# Patient Record
Sex: Female | Born: 1955
Health system: Southern US, Community
[De-identification: ages and names within clinical notes are randomized; demographics above are authoritative.]

## PROBLEM LIST (undated history)

## (undated) DIAGNOSIS — M5136 Other intervertebral disc degeneration, lumbar region: Secondary | ICD-10-CM

## (undated) DIAGNOSIS — D259 Leiomyoma of uterus, unspecified: Secondary | ICD-10-CM

## (undated) DIAGNOSIS — I700 Atherosclerosis of aorta: Secondary | ICD-10-CM

## (undated) DIAGNOSIS — K5790 Diverticulosis of intestine, part unspecified, without perforation or abscess without bleeding: Secondary | ICD-10-CM

## (undated) DIAGNOSIS — K649 Unspecified hemorrhoids: Secondary | ICD-10-CM

## (undated) DIAGNOSIS — K219 Gastro-esophageal reflux disease without esophagitis: Secondary | ICD-10-CM

## (undated) DIAGNOSIS — M5020 Other cervical disc displacement, unspecified cervical region: Secondary | ICD-10-CM

## (undated) DIAGNOSIS — M8580 Other specified disorders of bone density and structure, unspecified site: Secondary | ICD-10-CM

## (undated) DIAGNOSIS — Z8739 Personal history of other diseases of the musculoskeletal system and connective tissue: Secondary | ICD-10-CM

## (undated) DIAGNOSIS — K449 Diaphragmatic hernia without obstruction or gangrene: Secondary | ICD-10-CM

## (undated) DIAGNOSIS — I341 Nonrheumatic mitral (valve) prolapse: Secondary | ICD-10-CM

## (undated) DIAGNOSIS — K8020 Calculus of gallbladder without cholecystitis without obstruction: Secondary | ICD-10-CM

## (undated) DIAGNOSIS — K635 Polyp of colon: Secondary | ICD-10-CM

## (undated) DIAGNOSIS — E161 Other hypoglycemia: Secondary | ICD-10-CM

## (undated) DIAGNOSIS — M51369 Other intervertebral disc degeneration, lumbar region without mention of lumbar back pain or lower extremity pain: Secondary | ICD-10-CM

## (undated) DIAGNOSIS — K648 Other hemorrhoids: Secondary | ICD-10-CM

## (undated) HISTORY — DX: Atherosclerosis of aorta: I70.0

## (undated) HISTORY — PX: WISDOM TOOTH EXTRACTION: SHX21

## (undated) HISTORY — DX: Other cervical disc displacement, unspecified cervical region: M50.20

## (undated) HISTORY — DX: Diverticulosis of intestine, part unspecified, without perforation or abscess without bleeding: K57.90

## (undated) HISTORY — DX: Other hemorrhoids: K64.8

## (undated) HISTORY — DX: Other hypoglycemia: E16.1

## (undated) HISTORY — DX: Leiomyoma of uterus, unspecified: D25.9

## (undated) HISTORY — DX: Gastro-esophageal reflux disease without esophagitis: K21.9

## (undated) HISTORY — DX: Other specified disorders of bone density and structure, unspecified site: M85.80

## (undated) HISTORY — DX: Calculus of gallbladder without cholecystitis without obstruction: K80.20

## (undated) HISTORY — DX: Diaphragmatic hernia without obstruction or gangrene: K44.9

## (undated) HISTORY — DX: Nonrheumatic mitral (valve) prolapse: I34.1

## (undated) HISTORY — DX: Polyp of colon: K63.5

## (undated) HISTORY — DX: Personal history of other diseases of the musculoskeletal system and connective tissue: Z87.39

## (undated) HISTORY — DX: Other intervertebral disc degeneration, lumbar region: M51.36

## (undated) HISTORY — DX: Other intervertebral disc degeneration, lumbar region without mention of lumbar back pain or lower extremity pain: M51.369

## (undated) HISTORY — DX: Unspecified hemorrhoids: K64.9

---

## 1999-06-10 ENCOUNTER — Other Ambulatory Visit: Admission: RE | Admit: 1999-06-10 | Discharge: 1999-06-10 | Payer: Self-pay | Admitting: Gynecology

## 2000-06-03 ENCOUNTER — Encounter: Payer: Self-pay | Admitting: Gynecology

## 2000-06-03 ENCOUNTER — Encounter: Admission: RE | Admit: 2000-06-03 | Discharge: 2000-06-03 | Payer: Self-pay | Admitting: Gynecology

## 2000-06-08 ENCOUNTER — Emergency Department (HOSPITAL_COMMUNITY): Admission: EM | Admit: 2000-06-08 | Discharge: 2000-06-08 | Payer: Self-pay | Admitting: Emergency Medicine

## 2000-07-09 ENCOUNTER — Encounter (INDEPENDENT_AMBULATORY_CARE_PROVIDER_SITE_OTHER): Payer: Self-pay | Admitting: Specialist

## 2000-07-09 ENCOUNTER — Ambulatory Visit (HOSPITAL_COMMUNITY): Admission: RE | Admit: 2000-07-09 | Discharge: 2000-07-09 | Payer: Self-pay | Admitting: Gastroenterology

## 2000-07-31 ENCOUNTER — Other Ambulatory Visit: Admission: RE | Admit: 2000-07-31 | Discharge: 2000-07-31 | Payer: Self-pay | Admitting: Gynecology

## 2001-02-13 ENCOUNTER — Encounter: Admission: RE | Admit: 2001-02-13 | Discharge: 2001-02-13 | Payer: Self-pay | Admitting: Gastroenterology

## 2001-02-13 ENCOUNTER — Encounter: Payer: Self-pay | Admitting: Gastroenterology

## 2001-02-25 ENCOUNTER — Ambulatory Visit (HOSPITAL_COMMUNITY): Admission: RE | Admit: 2001-02-25 | Discharge: 2001-02-25 | Payer: Self-pay | Admitting: Gastroenterology

## 2001-08-11 ENCOUNTER — Other Ambulatory Visit: Admission: RE | Admit: 2001-08-11 | Discharge: 2001-08-11 | Payer: Self-pay | Admitting: Gynecology

## 2002-09-27 ENCOUNTER — Ambulatory Visit (HOSPITAL_COMMUNITY): Admission: RE | Admit: 2002-09-27 | Discharge: 2002-09-27 | Payer: Self-pay | Admitting: *Deleted

## 2002-11-11 ENCOUNTER — Encounter: Admission: RE | Admit: 2002-11-11 | Discharge: 2003-02-09 | Payer: Self-pay | Admitting: Family Medicine

## 2002-11-24 ENCOUNTER — Encounter: Admission: RE | Admit: 2002-11-24 | Discharge: 2002-11-24 | Payer: Self-pay | Admitting: Family Medicine

## 2002-11-24 ENCOUNTER — Encounter: Payer: Self-pay | Admitting: Family Medicine

## 2002-12-21 ENCOUNTER — Other Ambulatory Visit: Admission: RE | Admit: 2002-12-21 | Discharge: 2002-12-21 | Payer: Self-pay | Admitting: Gynecology

## 2003-02-10 ENCOUNTER — Encounter: Admission: RE | Admit: 2003-02-10 | Discharge: 2003-04-19 | Payer: Self-pay | Admitting: Family Medicine

## 2003-11-14 ENCOUNTER — Encounter: Admission: RE | Admit: 2003-11-14 | Discharge: 2003-11-14 | Payer: Self-pay | Admitting: Gynecology

## 2003-12-06 ENCOUNTER — Encounter: Admission: RE | Admit: 2003-12-06 | Discharge: 2003-12-06 | Payer: Self-pay | Admitting: Gynecology

## 2003-12-23 ENCOUNTER — Other Ambulatory Visit: Admission: RE | Admit: 2003-12-23 | Discharge: 2003-12-23 | Payer: Self-pay | Admitting: Gynecology

## 2004-12-25 ENCOUNTER — Other Ambulatory Visit: Admission: RE | Admit: 2004-12-25 | Discharge: 2004-12-25 | Payer: Self-pay | Admitting: Gynecology

## 2006-01-08 ENCOUNTER — Encounter: Admission: RE | Admit: 2006-01-08 | Discharge: 2006-01-08 | Payer: Self-pay | Admitting: Gynecology

## 2006-01-09 ENCOUNTER — Other Ambulatory Visit: Admission: RE | Admit: 2006-01-09 | Discharge: 2006-01-09 | Payer: Self-pay | Admitting: Gynecology

## 2007-02-27 ENCOUNTER — Other Ambulatory Visit: Admission: RE | Admit: 2007-02-27 | Discharge: 2007-02-27 | Payer: Self-pay | Admitting: Gynecology

## 2007-03-06 ENCOUNTER — Encounter: Admission: RE | Admit: 2007-03-06 | Discharge: 2007-03-06 | Payer: Self-pay | Admitting: Gynecology

## 2007-03-11 ENCOUNTER — Emergency Department (HOSPITAL_COMMUNITY): Admission: EM | Admit: 2007-03-11 | Discharge: 2007-03-11 | Payer: Self-pay | Admitting: Family Medicine

## 2007-03-13 ENCOUNTER — Encounter: Admission: RE | Admit: 2007-03-13 | Discharge: 2007-03-13 | Payer: Self-pay | Admitting: Specialist

## 2008-01-29 ENCOUNTER — Other Ambulatory Visit: Admission: RE | Admit: 2008-01-29 | Discharge: 2008-01-29 | Payer: Self-pay | Admitting: Gynecology

## 2008-02-01 ENCOUNTER — Emergency Department (HOSPITAL_COMMUNITY): Admission: EM | Admit: 2008-02-01 | Discharge: 2008-02-01 | Payer: Self-pay | Admitting: Family Medicine

## 2008-02-17 ENCOUNTER — Encounter: Admission: RE | Admit: 2008-02-17 | Discharge: 2008-02-17 | Payer: Self-pay | Admitting: Gynecology

## 2008-02-28 ENCOUNTER — Emergency Department (HOSPITAL_COMMUNITY): Admission: EM | Admit: 2008-02-28 | Discharge: 2008-02-28 | Payer: Self-pay | Admitting: Family Medicine

## 2008-11-01 ENCOUNTER — Emergency Department (HOSPITAL_COMMUNITY): Admission: EM | Admit: 2008-11-01 | Discharge: 2008-11-01 | Payer: Self-pay | Admitting: Emergency Medicine

## 2009-02-27 ENCOUNTER — Ambulatory Visit: Payer: Self-pay | Admitting: Gynecology

## 2009-02-27 ENCOUNTER — Encounter: Payer: Self-pay | Admitting: Gynecology

## 2009-02-27 ENCOUNTER — Other Ambulatory Visit: Admission: RE | Admit: 2009-02-27 | Discharge: 2009-02-27 | Payer: Self-pay | Admitting: Gynecology

## 2009-04-03 ENCOUNTER — Emergency Department (HOSPITAL_COMMUNITY): Admission: EM | Admit: 2009-04-03 | Discharge: 2009-04-03 | Payer: Self-pay | Admitting: Family Medicine

## 2009-06-22 ENCOUNTER — Encounter: Admission: RE | Admit: 2009-06-22 | Discharge: 2009-06-22 | Payer: Self-pay | Admitting: Gynecology

## 2009-09-21 ENCOUNTER — Ambulatory Visit: Payer: Self-pay | Admitting: Gynecology

## 2009-12-01 ENCOUNTER — Emergency Department (HOSPITAL_COMMUNITY): Admission: EM | Admit: 2009-12-01 | Discharge: 2009-12-01 | Payer: Self-pay | Admitting: Emergency Medicine

## 2009-12-21 ENCOUNTER — Emergency Department (HOSPITAL_COMMUNITY): Admission: EM | Admit: 2009-12-21 | Discharge: 2009-12-21 | Payer: Self-pay | Admitting: Family Medicine

## 2010-03-30 ENCOUNTER — Ambulatory Visit: Payer: Self-pay | Admitting: Gynecology

## 2010-03-30 ENCOUNTER — Other Ambulatory Visit: Admission: RE | Admit: 2010-03-30 | Discharge: 2010-03-30 | Payer: Self-pay | Admitting: Gynecology

## 2010-05-10 ENCOUNTER — Ambulatory Visit: Payer: Self-pay | Admitting: Gynecology

## 2010-05-15 ENCOUNTER — Ambulatory Visit: Payer: Self-pay | Admitting: Gynecology

## 2010-08-16 ENCOUNTER — Ambulatory Visit: Payer: Self-pay | Admitting: Gynecology

## 2010-08-31 ENCOUNTER — Encounter: Admission: RE | Admit: 2010-08-31 | Discharge: 2010-08-31 | Payer: Self-pay | Admitting: Gynecology

## 2010-11-24 ENCOUNTER — Encounter: Payer: Self-pay | Admitting: Gynecology

## 2010-11-25 ENCOUNTER — Encounter: Payer: Self-pay | Admitting: Gynecology

## 2011-01-17 DIAGNOSIS — J209 Acute bronchitis, unspecified: Secondary | ICD-10-CM

## 2011-03-19 NOTE — Consult Note (Signed)
Paige Lawrence, Paige Lawrence               ACCOUNT NO.:  [id number]   MEDICAL RECORD NO.:  [id number]          PATIENT TYPE:  EMS   LOCATION:  [facility]JO                         FACILITY:  [facility]   PHYSICIAN:  Vania Rea, M.D. DATE OF BIRTH:  08/25/1956   DATE OF CONSULTATION:  11/01/2008  DATE OF DISCHARGE:  11/01/2008                                 CONSULTATION   PRIMARY CARE PHYSICIAN:  Unassigned.   CARDIOLOGIST:  Madaline Savage, M.D.   CHIEF COMPLAINT:  Chest pressure.   HISTORY OF PRESENT ILLNESS:  This is a 55 year old Caucasian lady with a  history of anxiety disorder, mitral valve prolapse, and reported  episodes of reactive hypoglycemia who also reports a long history of  musculoskeletal problems related to both shoulders, who has been having  aching pain in the left arm episodically for about two weeks.  It  restarted today again and after a few hours of left arm achiness, she  had sudden onset of essential chest pressure associated heat in her body  and a discomfort in the left side of her neck.  After about an hour the  patient presented to the emergency room but problem had already resolved  by the time the patient got to the emergency room.  She received 161 mg  of aspirin but was afraid to take more aspirin because she does have a  history of GERD.  The patient has had no dyspnea nor dizziness.  She has  had no fever nor chills.  She does not have a history of PND nor lower  extremity edema.  The patient has been seen some five hours after the  initial onset of her pain, some four hours after presenting to the  emergency room, and she is requesting to be discharged home because she  feels fine.  She has had no similar episodes.  She did have stress test  done about four years ago which was negative.  She denies any history of  hyperlipidemia or diabetes.  She is cutting down on her smoking and  currently smokes less than half a pack per day.   PAST MEDICAL HISTORY:  1. Mitral valve prolapse.  2. Episodic hypoglycemia.  3. History of anxiety.  4. History of GERD.  5. History of bursitis of both shoulders.  6. History of rotator cuff tear in the right shoulder.  7. History of right latissimus dorsi spasm causing epigastric      discomfort, resolved with physical therapy.   MEDICATIONS:  Toprol XL, unknown dose, which she takes p.r.n. for  palpitations associated with mitral valve prolapse but has not taken any  for a very long time.  She says if she takes it for prolonged periods  she feels very sick.   ALLERGIES:  1. IBUPROFEN.  2. SULFA.   SOCIAL HISTORY:  She smokes less than half a pack per day.  She has  about two mixed drinks per month.  She denies illicit drug use.  She  works at [employer] in the [employer].  She does  secretarial work.   FAMILY HISTORY:  Significant for coronary artery disease in her brother  and hypertension in her mother.   REVIEW OF SYSTEMS:  On a 10-point review of systems, other than noted  above, is unremarkable.   PHYSICAL EXAMINATION:  GENERAL:  Pleasant, middle-aged, Caucasian lady  sitting up in bed, jovial and in no distress.  VITAL SIGNS:  Temperature is 98.5, her pulse is 85, respirations 16,  blood pressure 146/89.  She is saturating 98% on 2 L.  HEENT:  Her pupils are round and equal.  Mucous membranes pink and  anicteric.  NECK:  She has no cervical lymphadenopathy or thyromegaly.  No jugular  venous distention.  No carotid bruit.  CHEST:  Clear to auscultation bilaterally.  CARDIOVASCULAR:  Regular rhythm without murmur.  ABDOMEN:  Scaphoid, soft, and nontender.  EXTREMITIES:  Without edema.  She has dorsalis pedis pulses of 3+  bilaterally and equal.  CENTRAL NERVOUS SYSTEM:  Cranial nerves II-XII are grossly intact and  she has no focal neurologic deficits.   LABORATORY DATA:  Her white count is 6.8, hemoglobin 14.9, MCV is  elevated at 101.6, platelet count is normal at 198.   She has a normal  differential.  Her serum chemistry is essentially normal.  Her D-dimer  is negative.  First set of cardiac enzymes showed undetectable CK-MB,  undetectable troponin, and Myoglobin of 40.  Cardiac enzymes done at 9  p.m. some six hours after the onset of symptoms:  Her total CK is 51,  her CK-MB is 1.1, and her troponin is undetectable.  EKG shows sinus  rhythm.  No ST segment abnormality.   ASSESSMENT:  1. Atypical chest pain.  2. History of gastroesophageal reflux disease.  3. Extensive history of musculoskeletal disease.  4. History of mitral valve prolapse.   RECOMMENDATIONS:  1. Discharge home since the patient is requesting that.  2. Low dose metoprolol 12.5 mg twice daily.  3. Proton pump inhibitor.  4. Omeprazole at high dose 20 mg twice daily.  5. Follow up with primary cardiologist, Dr. Elsie Lincoln for stress test.  6. The patient is to return to the emergency room for recurrence of      symptoms or worsening of symptoms.      Vania Rea, M.D.  Electronically Signed     LC/MEDQ  D:  11/01/2008  T:  11/02/2008  Job:  [id number]   cc:   Madaline Savage, M.D.  Elise Benne, M.D.

## 2011-03-22 NOTE — Procedures (Signed)
[facility]  Patient:    Paige Lawrence, Paige Lawrence                      MRN: [id number] Proc. Date: 02/25/01 Adm. Date:  [id number] Attending:  Orland Mustard CC:         Desma Maxim, M.D.   Procedure Report  PROCEDURE:  Esophagogastroduodenoscopy.  MEDICATIONS:  Hurricaine spray, Fentanyl 50 mEq, and Versed 1 gm IV.  INDICATIONS:  Epigastric pain which has been refractory to proton pump inhibitors.  PROCEDURE: The procedure had been explained to the patient and consent obtained.  He was placed in the left lateral decubitus position, and the Olympus videoscope was inserted ______ into the esophagus and advanced under direct visualization. The stomach was entered.  The pylorus was identified and passed.  The duodenum, including the bulb and second portion were seen well and were unremarkable. The scope was withdrawn back to the antrum.  The antrum was normal.  The fundus and cardia were seen well in the retroflexed view and were normal.  There was a small hernia.  The distal esophagus was somewhat reddened.  There were no signs of active bleeding or ulceration.  The proximal esophagus was normal. The scope was withdrawn.  The patient tolerated the procedure well.  ASSESSMENT: Probable mild esophageal reflux.  PLAN: Continue on Nexium.  Give a reflux sheet.  See back in the office in six weeks. DD:  02/25/01 TD:  02/26/01 Job: [id number]

## 2011-03-22 NOTE — Procedures (Signed)
[facility]  Patient:    Paige Lawrence, Paige Lawrence                      MRN: [id number] Proc. Date: 07/09/00 Adm. Date:  [id number] Attending:  Orland Mustard CC:         Desma Maxim, M.D.                           Procedure Report  PROCEDURE:  Colonoscopy and polypectomy.  MEDICATIONS:  Fentanyl 100 mcg, Versed 9 mg IV.  SCOPE:  Olympus pediatric video colonoscope.  INDICATIONS FOR PROCEDURE:  New onset of bloody diarrhea. She has an aunt that had colon cancer. The bloody diarrhea has resolved. Stools remain positive and for this reason, a colonoscopy is performed.  DESCRIPTION OF PROCEDURE:  The procedure had been explained to the patient and consent obtained. With the patient in the left lateral decubitus position, the Olympus pediatric video colonoscope was then inserted following a digital rectal exam and advanced under direct vision. The prep was quite good. We were able to advance over to the cecum using abdominal pressure and position changes. She had a somewhat long tortuous colon. The ileocecal valve was seen as was the appendiceal orifice. Approximately 2 cm from the appendix was a sessile polyp approximately 0.5 cm. This was removed with a mini snare and sucked through the scope. No other polyps were seen in the cecum, ascending, transverse, descending or sigmoid colon and there was no significant diverticular disease. In the rectum at approximately 20 cm was another 0.5 cm sessile polyp that was removed with the snare and sucked through the scope. These were sent off in different jars. Moderate internal hemorrhoids are seen in the rectum upon removal of the scope. The scope was withdrawn. The patient tolerated the procedure well and was maintained on low flow oxygen and pulse oximeter throughout the procedure with no obvious problems.  ASSESSMENT: 1. Polyps in the cecum and rectum removed. 2. Internal hemorrhoids.  PLAN:  Will check  path on polyps and will recommend repeating procedure in 2 years. Will see her back in the office in 3 months and recheck her stools. Routine post polypectomy instructions. DD:  07/09/00 TD:  07/09/00 Job: [id number]

## 2011-05-30 ENCOUNTER — Encounter: Payer: Self-pay | Admitting: *Deleted

## 2011-06-04 ENCOUNTER — Ambulatory Visit (INDEPENDENT_AMBULATORY_CARE_PROVIDER_SITE_OTHER): Payer: 59 | Admitting: Gynecology

## 2011-06-04 ENCOUNTER — Other Ambulatory Visit (HOSPITAL_COMMUNITY)
Admission: RE | Admit: 2011-06-04 | Discharge: 2011-06-04 | Disposition: A | Discharge status: Home / Self Care | Payer: 59 | Source: Ambulatory Visit | Attending: Gynecology | Admitting: Gynecology

## 2011-06-04 ENCOUNTER — Encounter: Payer: Self-pay | Admitting: Gynecology

## 2011-06-04 VITALS — BP 116/62 | Ht 63.75 in | Wt 121.0 lb

## 2011-06-04 DIAGNOSIS — R109 Unspecified abdominal pain: Secondary | ICD-10-CM

## 2011-06-04 DIAGNOSIS — Z131 Encounter for screening for diabetes mellitus: Secondary | ICD-10-CM

## 2011-06-04 DIAGNOSIS — Z01419 Encounter for gynecological examination (general) (routine) without abnormal findings: Secondary | ICD-10-CM | POA: Insufficient documentation

## 2011-06-04 DIAGNOSIS — Z13220 Encounter for screening for lipoid disorders: Secondary | ICD-10-CM

## 2011-06-04 DIAGNOSIS — M899 Disorder of bone, unspecified: Secondary | ICD-10-CM

## 2011-06-04 DIAGNOSIS — R82998 Other abnormal findings in urine: Secondary | ICD-10-CM

## 2011-06-04 DIAGNOSIS — M949 Disorder of cartilage, unspecified: Secondary | ICD-10-CM

## 2011-06-04 NOTE — Progress Notes (Signed)
Paige Lawrence 1956/10/09 [id number]   History:    55 y.o.  for annual exam overall doing well. Postmenopausal with no history of bleeding. The followed for osteopenia. Does note some abdominal symptoms where she reports intermittently feeling almost pulsations in her lower abdomen. No nausea vomiting diarrhea or constipation doesn't occur all the time but she does actually notice it seems to be more with stress.  Past medical history,surgical history, family history and social history were all reviewed and documented in the EPIC chart. ROS:  Was performed and pertinent positives and negatives are included in the history.  Exam: chaperone present Filed Vitals:   06/04/11 1200  BP: 116/62   General appearance  Normal Skin grossly normal Head/Neck normal with no cervical or supraclavicular adenopathy thyroid normal Lungs  clear Cardiac RR, without RMG Abdominal  soft, nontender, without masses, guarding, rebound or organomegaly, no evidence of pulsation or palpable aneurysm Breasts  examined lying and sitting without masses, retractions, discharge or axillary adenopathy. Pelvic  Ext/BUS/vagina  normal mild atrophic changes  Cervix  normal   Uterus  anteverted, normal size, shape and contour, midline and mobile nontender   Adnexa  Without masses or tenderness  Anus and perineum  normal   Rectovaginal  normal sphincter tone without palpated masses or tenderness.   Assessment/Plan:  55 y.o. female for annual exam . #1 Gen. Health maintenance suppressants on the basis discussed at her she's due for mammogram now and she knows to schedule this. Increase calcium vitamin D was also reviewed. #2 Osteopenia last bone density July 2011 showed T score -2.2 at the AP spine all other values were normal. Vitamin D level was 39 in October 2011 the plan was to observe and repeat her bone density at a two-year interval. She is taking extra calcium vitamin D we'll go ahead and check baseline vitamin D  level today. #3 Lower abdominal discomfort patient reports pulsation-like feeling in her lower abdomen her exam is totally normal she states she she is due for colonoscopy now I've recommended that she go ahead and schedule this and let them take a look at her first period she's comfortable with GI followup first and then go from there. I put her in for a consult with the [facility] at her request she knows that if she does not hear from [place] or somebody else the tissue call me in 2 weeks to make sure that we arrange this. I did review with her the differential to include aneurysm although unlikely given the her normal exam, intermittent nature of symptoms and the association with anxiety. I suspect it's some form of irritable bowel. #4 Smoking. I again reviewed her smoking history offered help in cessation. Patient declines understands the risks. Assuming patient continues well from a gynecologic standpoint he'll see me in a year sooner as needed      Dara Lords MD, 2:05 PM 06/04/2011

## 2011-06-05 LAB — VITAMIN D 25 HYDROXY (VIT D DEFICIENCY, FRACTURES): Vit D, 25-Hydroxy: 37 ng/mL (ref 30–89)

## 2011-08-02 ENCOUNTER — Encounter: Payer: Self-pay | Admitting: Gastroenterology

## 2011-08-08 LAB — POCT CARDIAC MARKERS
CKMB, poc: <1 ng/mL — ABNORMAL LOW (ref 1.0–8.0)
Troponin i, poc: <0.05 ng/mL (ref 0.00–0.09)

## 2011-08-08 LAB — DIFFERENTIAL
Lymphocytes Relative: 22 % (ref 12–46)
Lymphs Abs: 1.5 10*3/uL (ref 0.7–4.0)
Monocytes Absolute: 0.5 10*3/uL (ref 0.1–1.0)
Neutro Abs: 4.7 10*3/uL (ref 1.7–7.7)

## 2011-08-08 LAB — POCT I-STAT, CHEM 8
Calcium, Ion: 1.25 mmol/L (ref 1.12–1.32)
Creatinine, Ser: 0.9 mg/dL (ref 0.4–1.2)
Glucose, Bld: 87 mg/dL (ref 70–99)
HCT: 45 % (ref 36.0–46.0)
Hemoglobin: 15.3 g/dL — ABNORMAL HIGH (ref 12.0–15.0)

## 2011-08-08 LAB — TROPONIN I: Troponin I: <0.01 ng/mL (ref 0.00–0.06)

## 2011-08-08 LAB — CK TOTAL AND CKMB (NOT AT ARMC)
Relative Index: INVALID (ref 0.0–2.5)
Total CK: 51 U/L (ref 7–177)

## 2011-08-08 LAB — CBC
MCHC: 34.1 g/dL (ref 30.0–36.0)
MCV: 101.6 fL — ABNORMAL HIGH (ref 78.0–100.0)
Platelets: 198 10*3/uL (ref 150–400)
RDW: 13.1 % (ref 11.5–15.5)

## 2011-08-08 LAB — D-DIMER, QUANTITATIVE: D-Dimer, Quant: <0.22 ug/mL-FEU (ref 0.00–0.48)

## 2011-08-22 ENCOUNTER — Encounter: Payer: Self-pay | Admitting: Internal Medicine

## 2011-08-23 ENCOUNTER — Ambulatory Visit (INDEPENDENT_AMBULATORY_CARE_PROVIDER_SITE_OTHER): Payer: 59 | Admitting: Internal Medicine

## 2011-08-23 ENCOUNTER — Encounter: Payer: Self-pay | Admitting: Internal Medicine

## 2011-08-23 VITALS — BP 112/66 | HR 88 | Temp 98.2°F | Wt 123.0 lb

## 2011-08-23 DIAGNOSIS — J40 Bronchitis, not specified as acute or chronic: Secondary | ICD-10-CM

## 2011-08-23 DIAGNOSIS — Z87891 Personal history of nicotine dependence: Secondary | ICD-10-CM

## 2011-08-23 NOTE — Patient Instructions (Signed)
Take Levaquin 500 mg daily for 7 days. Take Tessalon Perles if needed for cough. Call if not better in 7-10 days or sooner if worse.

## 2011-08-23 NOTE — Progress Notes (Signed)
  Subjective:    Patient ID: Paige Lawrence, female    DOB: [birth year], 55 y.o.   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 Cough with slight discolored sputum. Chest congestion. No fever or chills.    Review of Systems     Objective:   Physical Exam TMs are clear; pharynx slightly injected; neck is supple without adenopathy; chest is clear without rales or wheezing        Assessment & Plan:  Bronchitis  Plan: Levaquin 500 mg daily with a meal for 7 days. Tessalon Perles 100 mg (#60) 2 by mouth 3 times a day when necessary for cough. Call if not better in 7-10 days or sooner if worse.

## 2011-10-18 ENCOUNTER — Other Ambulatory Visit: Payer: Self-pay | Admitting: Gynecology

## 2011-10-18 DIAGNOSIS — Z1231 Encounter for screening mammogram for malignant neoplasm of breast: Secondary | ICD-10-CM

## 2011-10-21 ENCOUNTER — Telehealth: Payer: Self-pay

## 2011-10-21 DIAGNOSIS — K5289 Other specified noninfective gastroenteritis and colitis: Secondary | ICD-10-CM

## 2011-10-21 MED ORDER — ONDANSETRON HCL 8 MG PO TABS: 8.0000 mg | ORAL_TABLET | Freq: Three times a day (TID) | ORAL | Status: AC | PRN

## 2011-10-21 NOTE — Telephone Encounter (Signed)
Pt should cancel tooth extraction. This is likely Norovirus that has been going around. Advise clear liquids until N & V resolve for 24 hours and advance diet slowly. She should call dentist about continuing Amoxicillin. I do not think Amoxicillin caused this. Does she need something for nausea?  MJB

## 2011-10-21 NOTE — Telephone Encounter (Signed)
Patient informed of this. She would like something for the nausea. Per Dr. Lenord Fellers, [place] for Zofran 8mg  1 po q6h prn #20/0 refills, sent to [facility].

## 2011-11-06 ENCOUNTER — Ambulatory Visit
Admission: RE | Admit: 2011-11-06 | Discharge: 2011-11-06 | Disposition: A | Discharge status: Home / Self Care | Payer: 59 | Source: Ambulatory Visit | Attending: Gynecology | Admitting: Gynecology

## 2011-11-06 DIAGNOSIS — Z1231 Encounter for screening mammogram for malignant neoplasm of breast: Secondary | ICD-10-CM

## 2011-12-09 ENCOUNTER — Encounter: Payer: Self-pay | Admitting: *Deleted

## 2011-12-09 NOTE — Telephone Encounter (Signed)
error 

## 2012-03-06 ENCOUNTER — Ambulatory Visit (INDEPENDENT_AMBULATORY_CARE_PROVIDER_SITE_OTHER): Payer: 59 | Admitting: Internal Medicine

## 2012-03-06 ENCOUNTER — Encounter: Payer: Self-pay | Admitting: Internal Medicine

## 2012-03-06 VITALS — BP 136/80 | HR 92 | Temp 100.1°F | Wt 123.0 lb

## 2012-03-06 DIAGNOSIS — J4489 Other specified chronic obstructive pulmonary disease: Secondary | ICD-10-CM

## 2012-03-06 DIAGNOSIS — J040 Acute laryngitis: Secondary | ICD-10-CM

## 2012-03-06 DIAGNOSIS — J449 Chronic obstructive pulmonary disease, unspecified: Secondary | ICD-10-CM

## 2012-03-06 DIAGNOSIS — J069 Acute upper respiratory infection, unspecified: Secondary | ICD-10-CM

## 2012-03-06 DIAGNOSIS — R002 Palpitations: Secondary | ICD-10-CM | POA: Insufficient documentation

## 2012-03-06 NOTE — Progress Notes (Signed)
  Subjective:    Patient ID: Paige Lawrence, female    DOB: 1955/12/02, 56 y.o.   MRN: [id number]  HPI 56 year old white female smoker with onset yesterday of URI symptoms. Has low-grade fever, cough and congestion. Is also hoarse when she speaks. Patient also says she has history of mitral valve prolapse. Was taking Zyrtec-D for congestion recently and noticed palpitations. Says she used to have beta blocker on hand to take when she was having palpitations that she thought was related to mitral valve prolapse.    Review of Systems     Objective:   Physical Exam HEENT exam: Right TM is full and pink left TM appeared by cerumen. Pharynx is slightly injected without exudate. Neck is supple. Chest clear without wheezing. Cardiac exam regular rate and rhythm without clicks        Assessment & Plan:  Acute upper respiratory infection  Laryngitis  COPD  Palpitations related to decongestant consumption  Plan: Samples of Avelox 400 mg daily for 6 days. Tessalon Perles 100 mg (#60) 2 by mouth 3 times a day when necessary cough with 1 refill. Lopressor 50 mg per and 6 #30) one half to one by mouth when necessary palpitations with one refill. Explained to patient to use this only for sustained episodes of palpitations.

## 2012-03-06 NOTE — Patient Instructions (Addendum)
Take Avelox daily for 6 days. Use Tessalon Perles as needed for cough. Take Lopressor only for sustained episodes of palpitations. Call if not better in 7-10 days or sooner if worse

## 2012-07-17 ENCOUNTER — Encounter: Payer: Self-pay | Admitting: Internal Medicine

## 2012-08-05 ENCOUNTER — Emergency Department (HOSPITAL_COMMUNITY)
Admission: EM | Admit: 2012-08-05 | Discharge: 2012-08-05 | Disposition: A | Discharge status: Home / Self Care | Payer: 59 | Attending: Emergency Medicine | Admitting: Emergency Medicine

## 2012-08-05 ENCOUNTER — Encounter (HOSPITAL_COMMUNITY): Payer: Self-pay | Admitting: Adult Health

## 2012-08-05 ENCOUNTER — Emergency Department (HOSPITAL_COMMUNITY): Payer: 59

## 2012-08-05 DIAGNOSIS — F17200 Nicotine dependence, unspecified, uncomplicated: Secondary | ICD-10-CM | POA: Insufficient documentation

## 2012-08-05 DIAGNOSIS — R079 Chest pain, unspecified: Secondary | ICD-10-CM | POA: Insufficient documentation

## 2012-08-05 LAB — BASIC METABOLIC PANEL
Chloride: 104 mEq/L (ref 96–112)
GFR calc Af Amer: >90 mL/min (ref >90)
GFR calc non Af Amer: >90 mL/min (ref >90)
Glucose, Bld: 106 mg/dL — ABNORMAL HIGH (ref 70–99)
Potassium: 4 mEq/L (ref 3.5–5.1)
Sodium: 141 mEq/L (ref 135–145)

## 2012-08-05 LAB — CBC
Hemoglobin: 15.3 g/dL — ABNORMAL HIGH (ref 12.0–15.0)
MCHC: 34.8 g/dL (ref 30.0–36.0)
WBC: 8.1 10*3/uL (ref 4.0–10.5)

## 2012-08-05 LAB — POCT I-STAT TROPONIN I

## 2012-08-05 NOTE — ED Provider Notes (Signed)
History     CSN: [id number]  Arrival date & time 08/05/12  1537   None     Chief Complaint  Patient presents with  . Chest Pain    (Consider location/radiation/quality/duration/timing/severity/associated sxs/prior treatment) HPI Comments: The patient is a 56 year old woman who says she was cooking on "Sunday, 3 days ago had a feeling come on her that felt like when you first get flu, a sick feeling. Then she had jaw pain, and then she had chest pressure. She thought it was esophageal reflux and took a Nexium. She had some burping which gave some relief. Since then she's had burping off and on with chest pressure. Today she had an especially bad bout of it and called [facility], and was advised to come to the ED for evaluation. There is no prior history of heart attack. She has had mitral valve prolapse diagnosed in the past as well as gastroesophageal reflux disease. She also smokes a pack of cigarettes a day.  Patient is a 55 y.o. female presenting with chest pain.  Chest Pain The chest pain began 3 - 5 days ago. Duration of episode(s) is 3 minutes. Chest pain occurs intermittently. The chest pain is unchanged. Associated with: Nothing. The severity of the pain is moderate. The quality of the pain is described as burning. The pain does not radiate. Exacerbated by: Nothing. Pertinent negatives for primary symptoms include no fever. Treatments tried: She took Nexium with some relief. Risk factors include sedentary lifestyle and smoking/tobacco exposure.  Her past medical history is significant for mitral valve prolapse. Past medical history comments: GERD     Past Medical History  Diagnosis Date  . Mitral valve prolapse   . Osteopenia     -1.7 2/05; -1.4 3/07; -2.2 7/11  . Vitamin d deficiency     26"  4/10; 28 5/11    Past Surgical History  Procedure Date  . Cesarean section   . Wisdom tooth extraction     Family History  Problem Relation Age of Onset  .  Heart disease Brother   . Cancer Maternal Aunt     colon  . Diabetes Paternal Aunt   . Breast cancer Cousin     maternal cousin  . Hypertension Mother     History  Substance Use Topics  . Smoking status: Current Every Day Smoker -- 0.5 packs/day  . Smokeless tobacco: Never Used  . Alcohol Use: Yes     vrey rarely    OB History    Grav Para Term Preterm Abortions TAB SAB Ect Mult Living   2 2        2       Review of Systems  Constitutional: Negative.  Negative for fever and chills.  HENT: Negative.   Eyes: Negative.   Respiratory: Negative.   Cardiovascular: Positive for chest pain.  Gastrointestinal:       Burping   Genitourinary: Negative.   Musculoskeletal: Negative.   Skin: Negative.   Neurological: Negative.   Psychiatric/Behavioral: Negative.     Allergies  Aspirin; Ibuprofen; and Sulfa antibiotics  Home Medications   Current Outpatient Rx  Name Route Sig Dispense Refill  . ESOMEPRAZOLE MAGNESIUM 40 MG PO CPDR Oral Take 40 mg by mouth daily before breakfast.        BP 165/95  Pulse 96  Temp 98.9 F (37.2 C) (Oral)  Resp 22  SpO2 96%  LMP 11/04/1996  Physical Exam  Nursing note and vitals reviewed. Constitutional: She  is oriented to person, place, and time. She appears well-developed and well-nourished. No distress.       Thin middle aged woman, appears mildly anxious.  HENT:  Head: Normocephalic and atraumatic.  Right Ear: External ear normal.  Left Ear: External ear normal.  Mouth/Throat: Oropharynx is clear and moist.  Eyes: Conjunctivae normal and EOM are normal. Pupils are equal, round, and reactive to light. No scleral icterus.  Neck: Normal range of motion. Neck supple.  Cardiovascular: Normal rate, regular rhythm and normal heart sounds.   Pulmonary/Chest: Effort normal and breath sounds normal.       She localizes pain to the lower sternal area.  There is no point tenderness.  Abdominal: Soft. Bowel sounds are normal.    Musculoskeletal: Normal range of motion. She exhibits no edema and no tenderness.  Neurological: She is alert and oriented to person, place, and time.       No sensory or motor deficit.  Skin: Skin is warm and dry.  Psychiatric: She has a normal mood and affect. Her behavior is normal.    ED Course  Procedures (including critical care time)  Labs Reviewed  CBC - Abnormal; Notable for the following:    Hemoglobin 15.3 (*)     All other components within normal limits  POCT I-STAT TROPONIN I  BASIC METABOLIC PANEL   Dg Chest 2 View  08/05/2012  *RADIOLOGY REPORT*  Clinical Data: Chest pain  CHEST - 2 VIEW  Comparison: 12/21/2009  Findings: Lungs are hyperinflated but clear.  There are coarsened interstitial markings noted bilaterally.  No airspace consolidation identified.  Review of the visualized osseous structures is unremarkable.  IMPRESSION:  1.  No acute cardiopulmonary abnormalities. 2.  Hyperinflation and coarsened interstitial markings suggest COPD.   Original Report Authenticated By: Rosealee Albee, M.D.     Date: 08/05/2012  Rate: 92  Rhythm: normal sinus rhythm  QRS Axis: right  Intervals: normal PQRS:  Left atrial abnormality  ST/T Wave abnormalities: normal  Conduction Disutrbances:none  Narrative Interpretation: Borderline EKG  Old EKG Reviewed: unchanged  Results for orders placed during the hospital encounter of 08/05/12  CBC      Component Value Range   WBC 8.1  4.0 - 10.5 K/uL   RBC 4.55  3.87 - 5.11 MIL/uL   Hemoglobin 15.3 (*) 12.0 - 15.0 g/dL   HCT 16.1  09.6 - 04.5 %   MCV 96.7  78.0 - 100.0 fL   MCH 33.6  26.0 - 34.0 pg   MCHC 34.8  30.0 - 36.0 g/dL   RDW 40.9  81.1 - 91.4 %   Platelets 215  150 - 400 K/uL  BASIC METABOLIC PANEL      Component Value Range   Sodium 141  135 - 145 mEq/L   Potassium 4.0  3.5 - 5.1 mEq/L   Chloride 104  96 - 112 mEq/L   CO2 26  19 - 32 mEq/L   Glucose, Bld 106 (*) 70 - 99 mg/dL   BUN 7  6 - 23 mg/dL    Creatinine, Ser 7.82  0.50 - 1.10 mg/dL   Calcium 95.6 (*) 8.4 - 10.5 mg/dL   GFR calc non Af Amer >90  >90 mL/min   GFR calc Af Amer >90  >90 mL/min  POCT I-STAT TROPONIN I      Component Value Range   Troponin i, poc 0.00  0.00 - 0.08 ng/mL   Comment 3  Dg Chest 2 View  08/05/2012  *RADIOLOGY REPORT*  Clinical Data: Chest pain  CHEST - 2 VIEW  Comparison: 12/21/2009  Findings: Lungs are hyperinflated but clear.  There are coarsened interstitial markings noted bilaterally.  No airspace consolidation identified.  Review of the visualized osseous structures is unremarkable.  IMPRESSION:  1.  No acute cardiopulmonary abnormalities. 2.  Hyperinflation and coarsened interstitial markings suggest COPD.   Original Report Authenticated By: Rosealee Albee, M.D.     Case discussed with Dr. Herbie Baltimore of [facility]. Pt can be released; their office will call her up to arrange visit with cardiologist, further tests.  1. Nonspecific chest pain           Carleene Cooper III, MD 08/05/12 1818

## 2012-08-05 NOTE — ED Notes (Addendum)
Reports Chest pressure and bilateral jaw pain that began Sunday associated with fatigue and is intermittitent, this am had chest pressure associated with tingling down left arm, pt states. "the more I burp the more it goes away" walking makes pain worse.  pt is tearful and anxious.  Denies nausea and SOB.

## 2012-08-05 NOTE — ED Notes (Signed)
Family at bedside. 

## 2012-08-25 ENCOUNTER — Ambulatory Visit (AMBULATORY_SURGERY_CENTER): Payer: 59

## 2012-08-25 VITALS — Ht 63.25 in | Wt 130.2 lb

## 2012-08-25 DIAGNOSIS — Z800 Family history of malignant neoplasm of digestive organs: Secondary | ICD-10-CM

## 2012-08-25 DIAGNOSIS — Z86010 Personal history of colonic polyps: Secondary | ICD-10-CM

## 2012-08-25 DIAGNOSIS — Z1211 Encounter for screening for malignant neoplasm of colon: Secondary | ICD-10-CM

## 2012-08-25 DIAGNOSIS — Z8371 Family history of colonic polyps: Secondary | ICD-10-CM

## 2012-08-25 MED ORDER — NA SULFATE-K SULFATE-MG SULF 17.5-3.13-1.6 GM/177ML PO SOLN: 1.0000 | Freq: Once | ORAL | Status: DC

## 2012-08-25 NOTE — Progress Notes (Signed)
Pt states she had a colonoscopy done over 8 years ago at [facility] (,not sure of the doctors name.) I was unable to find records

## 2012-09-07 ENCOUNTER — Ambulatory Visit (AMBULATORY_SURGERY_CENTER): Payer: 59 | Admitting: Internal Medicine

## 2012-09-07 ENCOUNTER — Encounter: Payer: Self-pay | Admitting: Internal Medicine

## 2012-09-07 VITALS — BP 134/45 | HR 72 | Temp 98.3°F | Resp 21 | Ht 63.25 in | Wt 130.0 lb

## 2012-09-07 DIAGNOSIS — Z1211 Encounter for screening for malignant neoplasm of colon: Secondary | ICD-10-CM

## 2012-09-07 DIAGNOSIS — K635 Polyp of colon: Secondary | ICD-10-CM

## 2012-09-07 DIAGNOSIS — D126 Benign neoplasm of colon, unspecified: Secondary | ICD-10-CM

## 2012-09-07 MED ORDER — SODIUM CHLORIDE 0.9 % IV SOLN: 500.0000 mL | INTRAVENOUS | Status: DC

## 2012-09-07 NOTE — Progress Notes (Signed)
Patient did not have preoperative order for IV antibiotic SSI prophylaxis. (G8918)  Patient did not experience any of the following events: a burn prior to discharge; a fall within the facility; wrong site/side/patient/procedure/implant event; or a hospital transfer or hospital admission upon discharge from the facility. (G8907)  

## 2012-09-07 NOTE — Patient Instructions (Addendum)
YOU HAD AN ENDOSCOPIC PROCEDURE TODAY AT THE [facility]: Refer to the procedure report that was given to you for any specific questions about what was found during the examination.  If the procedure report does not answer your questions, please call your gastroenterologist to clarify.  If you requested that your care partner not be given the details of your procedure findings, then the procedure report has been included in a sealed envelope for you to review at your convenience later.  YOU SHOULD EXPECT: Some feelings of bloating in the abdomen. Passage of more gas than usual.  Walking can help get rid of the air that was put into your GI tract during the procedure and reduce the bloating. If you had a lower endoscopy (such as a colonoscopy or flexible sigmoidoscopy) you may notice spotting of blood in your stool or on the toilet paper. If you underwent a bowel prep for your procedure, then you may not have a normal bowel movement for a few days.  DIET: Your first meal following the procedure should be a light meal and then it is ok to progress to your normal diet.  A half-sandwich or bowl of soup is an example of a good first meal.  Heavy or fried foods are harder to digest and may make you feel nauseous or bloated.  Likewise meals heavy in dairy and vegetables can cause extra gas to form and this can also increase the bloating.  Drink plenty of fluids but you should avoid alcoholic beverages for 24 hours.  ACTIVITY: Your care partner should take you home directly after the procedure.  You should plan to take it easy, moving slowly for the rest of the day.  You can resume normal activity the day after the procedure however you should NOT DRIVE or use heavy machinery for 24 hours (because of the sedation medicines used during the test).    SYMPTOMS TO REPORT IMMEDIATELY: A gastroenterologist can be reached at any hour.  During normal business hours, 8:30 AM to 5:00 PM Monday through Friday,  call [phone].  After hours and on weekends, please call the GI answering service at [phone] who will take a message and have the physician on call contact you.   Following lower endoscopy (colonoscopy or flexible sigmoidoscopy):  Excessive amounts of blood in the stool  Significant tenderness or worsening of abdominal pains  Swelling of the abdomen that is new, acute  Fever of 100F or higher  FOLLOW UP: If any biopsies were taken you will be contacted by phone or by letter within the next 1-3 weeks.  Call your gastroenterologist if you have not heard about the biopsies in 3 weeks.  Our staff will call the home number listed on your records the next business day following your procedure to check on you and address any questions or concerns that you may have at that time regarding the information given to you following your procedure. This is a courtesy call and so if there is no answer at the home number and we have not heard from you through the emergency physician on call, we will assume that you have returned to your regular daily activities without incident.  SIGNATURES/CONFIDENTIALITY: You and/or your care partner have signed paperwork which will be entered into your electronic medical record.  These signatures attest to the fact that that the information above on your After Visit Summary has been reviewed and is understood.  Full responsibility of the confidentiality of this   discharge information lies with you and/or your care-partner.   Thank-you for choosing us for your healthcare needs. 

## 2012-09-07 NOTE — Op Note (Signed)
[facility] [address] [place], [address]   COLONOSCOPY PROCEDURE REPORT  PATIENT: Paige, Lawrence  MR#: [id number] BIRTHDATE: 07/17/1956 , 55  yrs. old GENDER: Female ENDOSCOPIST: Beverley Fiedler, MD REFERRED ON:GEXBMW, [facility] PROCEDURE DATE:  09/07/2012 PROCEDURE:   Colonoscopy with snare polypectomy and Colonoscopy with cold biopsy polypectomy ASA CLASS:   Class II INDICATIONS:elevated risk screening, patient's personal history of colon polyps, and last colonoscopy performed 10 years ago. MEDICATIONS: MAC sedation, administered by CRNA and propofol (Diprivan) 350mg  IV  DESCRIPTION OF PROCEDURE:   After the risks benefits and alternatives of the procedure were thoroughly explained, informed consent was obtained.  A digital rectal exam revealed no rectal mass.   The LB [id number]-[id number]  endoscope was introduced through the anus and advanced to the cecum, which was identified by both the appendix and ileocecal valve. No adverse events experienced. The quality of the prep was good, using MoviPrep  The instrument was then slowly withdrawn as the colon was fully examined.  COLON FINDINGS: A sessile polyp measuring 2 mm in size was found at the cecum.  A polypectomy was performed with cold forceps.  The resection was complete and the polyp tissue was completely retrieved.   Five sessile polyps ranging between 3-55mm in size were found in the sigmoid colon and rectum.  Polypectomy was performed using cold snare (1) and with cold forceps (4).  The resections were complete and the polyp tissue was partially retrieved (rectosigmoid cold snare polyp not retrieved but completely resected).   There was moderate diverticulosis noted in the sigmoid colon with associated muscular hypertrophy. Retroflexed views revealed internal hemorrhoids. The time to cecum=6 minutes 39 seconds.  Withdrawal time=14 minutes 52 seconds.  The scope was withdrawn and the procedure  completed. COMPLICATIONS: There were no complications.  ENDOSCOPIC IMPRESSION: 1.   Sessile polyp measuring 2 mm in size was found at the cecum; polypectomy was performed with cold forceps 2.   Five sessile polyps ranging between 3-73mm in size were found in the sigmoid colon and rectum; Polypectomy was performed using cold snare and with cold forceps 3.   There was moderate diverticulosis noted in the sigmoid colon 4.   Small internal hemorrhoids  RECOMMENDATIONS: 1.  Await pathology results 2.  High fiber diet 3.  Timing of repeat colonoscopy will be determined by pathology findings. 4.  You will receive a letter within 1-2 weeks with the results of your biopsy as well as final recommendations.  Please call my office if you have not received a letter after 3 weeks.  eSigned:  Beverley Fiedler, MD 09/07/2012 9:37 AM      cc: Sharlet Salina, MD and The Patient

## 2012-09-08 ENCOUNTER — Telehealth: Payer: Self-pay | Admitting: *Deleted

## 2012-09-08 NOTE — Telephone Encounter (Signed)
  Follow up Call-  Call back number 09/07/2012  Post procedure Call Back phone  # [phone]  Permission to leave phone message Yes     Patient questions:  Do you have a fever, pain , or abdominal swelling? no Pain Score  0 *  Have you tolerated food without any problems? yes  Have you been able to return to your normal activities? yes  Do you have any questions about your discharge instructions: Diet   no Medications  no Follow up visit  no  Do you have questions or concerns about your Care? no  Actions: * If pain score is 4 or above: No action needed, pain <4.

## 2012-09-10 ENCOUNTER — Encounter: Payer: Self-pay | Admitting: Internal Medicine

## 2013-05-07 ENCOUNTER — Emergency Department (HOSPITAL_COMMUNITY)
Admission: EM | Admit: 2013-05-07 | Discharge: 2013-05-07 | Disposition: A | Discharge status: Home / Self Care | Payer: 59 | Source: Home / Self Care | Attending: Emergency Medicine | Admitting: Emergency Medicine

## 2013-05-07 ENCOUNTER — Encounter (HOSPITAL_COMMUNITY): Payer: Self-pay | Admitting: Emergency Medicine

## 2013-05-07 DIAGNOSIS — R599 Enlarged lymph nodes, unspecified: Secondary | ICD-10-CM

## 2013-05-07 DIAGNOSIS — J029 Acute pharyngitis, unspecified: Secondary | ICD-10-CM

## 2013-05-07 DIAGNOSIS — R591 Generalized enlarged lymph nodes: Secondary | ICD-10-CM

## 2013-05-07 MED ORDER — PREDNISONE 20 MG PO TABS: 20.0000 mg | ORAL_TABLET | Freq: Once | ORAL | Status: DC

## 2013-05-07 MED ORDER — METHYLPREDNISOLONE 4 MG PO KIT: PACK | ORAL | Status: DC

## 2013-05-07 MED ORDER — PREDNISONE 20 MG PO TABS: 40.0000 mg | ORAL_TABLET | Freq: Every day | ORAL | Status: DC

## 2013-05-07 MED ORDER — PREDNISONE 20 MG PO TABS
ORAL_TABLET | ORAL | Status: AC
  Filled 2013-05-07: qty 2

## 2013-05-07 MED ORDER — CHLORPHEN-PSEUDOEPHED-APAP 2-30-500 MG PO TABS: 2.0000 | ORAL_TABLET | Freq: Four times a day (QID) | ORAL | Status: DC | PRN

## 2013-05-07 NOTE — ED Notes (Signed)
Pt c/o swollen glands and burning from her neck up to her ears onset this morning. Hurts to swallow. Throat is very sore and ears are burning. Pt is alert and oriented.

## 2013-05-07 NOTE — ED Provider Notes (Signed)
History    CSN: [id number] Arrival date & time 05/07/13  1802  First MD Initiated Contact with Patient 05/07/13 1828     Chief Complaint  Patient presents with  . Sore Throat   (Consider location/radiation/quality/duration/timing/severity/associated sxs/prior Treatment) HPI Comments: Patient has burning in her throat and swollen glands since this morning. She first fell this this morning. She took some ibuprofen which did help and she felt fine until later this afternoon she started to have the pain again. She feels a burning, swelling sensation in the back of her throat that goes up toward her ears. She says the throat is somewhat sore as well. She denies fever, shortness of breath, rash, ear pain. She has no sick contacts or recent travel history  Patient is a 57 y.o. female presenting with pharyngitis.  Sore Throat Pertinent negatives include no chest pain, no abdominal pain and no shortness of breath.   Past Medical History  Diagnosis Date  . Mitral valve prolapse   . Osteopenia     -1.7 2/05; -1.4 3/07; -2.2 7/11  . Vitamin D deficiency     26 4/10; 28 5/11  . GERD (gastroesophageal reflux disease)   . Hypoglycemic reaction   . Hx of spinal stenosis   . DDD (degenerative disc disease), lumbar   . Herniated disc, cervical     c6-7  . Hemorrhoids    Past Surgical History  Procedure Laterality Date  . Cesarean section    . Wisdom tooth extraction    . Colonoscopy     Family History  Problem Relation Age of Onset  . Heart disease Brother   . Cancer Maternal Aunt     colon  . Colon cancer Maternal Aunt   . Diabetes Paternal Aunt   . Breast cancer Cousin     maternal cousin  . Hypertension Mother   . Colon polyps Father    History  Substance Use Topics  . Smoking status: Current Every Day Smoker -- 0.50 packs/day    Types: Cigarettes  . Smokeless tobacco: Never Used  . Alcohol Use: No     Comment: vrey rarely   OB History   Grav Para Term Preterm Abortions  TAB SAB Ect Mult Living   2 2        2      Review of Systems  Constitutional: Negative for fever and chills.  HENT: Positive for sore throat and trouble swallowing. Negative for ear pain, congestion, facial swelling, rhinorrhea, mouth sores, neck pain, neck stiffness, dental problem, voice change, postnasal drip, sinus pressure, tinnitus and ear discharge.   Eyes: Negative for visual disturbance.  Respiratory: Negative for cough and shortness of breath.   Cardiovascular: Negative for chest pain, palpitations and leg swelling.  Gastrointestinal: Negative for nausea, vomiting and abdominal pain.  Endocrine: Negative for polydipsia and polyuria.  Genitourinary: Negative for dysuria, urgency and frequency.  Musculoskeletal: Negative for myalgias and arthralgias.  Skin: Negative for rash.  Neurological: Negative for dizziness, weakness and light-headedness.    Allergies  Aspirin; Ibuprofen; and Sulfa antibiotics  Home Medications   Current Outpatient Rx  Name  Route  Sig  Dispense  Refill  . chlorpheniramine-pseudoephedrine-acetaminophen (SINUTAB) 2-30-500 MG per tablet   Oral   Take 2 tablets by mouth every 6 (six) hours as needed for allergies.   30 tablet   0   . esomeprazole (NEXIUM) 40 MG capsule   Oral   Take 40 mg by mouth daily before breakfast.           .  methylPREDNISolone (MEDROL DOSEPAK) 4 MG tablet      Use as directed   21 tablet   0     Dispense as written.    BP 151/89  Pulse 85  Temp(Src) 100.1 F (37.8 C) (Oral)  Resp 16  SpO2 96%  LMP 11/04/1996 Physical Exam  Nursing note and vitals reviewed. Constitutional: She is oriented to person, place, and time. Vital signs are normal. She appears well-developed and well-nourished. No distress.  HENT:  Head: Normocephalic and atraumatic.  Right Ear: Hearing, tympanic membrane and ear canal normal.  Left Ear: Hearing, tympanic membrane and ear canal normal.  Nose: Nose normal. Right sinus exhibits no  maxillary sinus tenderness and no frontal sinus tenderness. Left sinus exhibits no maxillary sinus tenderness and no frontal sinus tenderness.  Mouth/Throat: Uvula is midline, oropharynx is clear and moist and mucous membranes are normal.  Eyes: EOM are normal. Pupils are equal, round, and reactive to light.  Cardiovascular: Normal rate, regular rhythm and normal heart sounds.  Exam reveals no gallop and no friction rub.   No murmur heard. Pulmonary/Chest: Effort normal and breath sounds normal. No respiratory distress. She has no wheezes. She has no rales.  Abdominal: Soft. There is no tenderness.  Lymphadenopathy:       Head (right side): Submandibular and tonsillar adenopathy present. No preauricular and no posterior auricular adenopathy present.       Head (left side): Submandibular and tonsillar adenopathy present. No preauricular and no posterior auricular adenopathy present.    She has no cervical adenopathy.    She has no axillary adenopathy.  Neurological: She is alert and oriented to person, place, and time. She has normal strength.  Skin: Skin is warm and dry. She is not diaphoretic.  Psychiatric: She has a normal mood and affect. Her behavior is normal. Judgment normal.    ED Course  Procedures (including critical care time) Labs Reviewed  CULTURE, GROUP A STREP   No results found. 1. Lymphadenopathy   2. Pharyngitis     MDM  The patient has lymphadenopathy program to sharp is negative and there are no other signs of any specific infection. We'll treat symptomatically with Medrol Dosepak and Tylenol Sinus. She will followup if she does not improve.   Meds ordered this encounter  Medications  . DISCONTD: predniSONE (DELTASONE) tablet 40 mg    Sig:   . methylPREDNISolone (MEDROL DOSEPAK) 4 MG tablet    Sig: Use as directed    Dispense:  21 tablet    Refill:  0  . chlorpheniramine-pseudoephedrine-acetaminophen (SINUTAB) 2-30-500 MG per tablet    Sig: Take 2 tablets by  mouth every 6 (six) hours as needed for allergies.    Dispense:  30 tablet    Refill:  0  . predniSONE (DELTASONE) tablet 20 mg    Sig:      Graylon Good, PA-C 05/07/13 2044

## 2013-05-08 NOTE — ED Provider Notes (Signed)
Medical screening examination/treatment/procedure(s) were performed by non-physician practitioner and as supervising physician I was immediately available for consultation/collaboration.  Leslee Home, M.D.   Reuben Likes, MD 05/08/13 [phone]

## 2013-05-09 LAB — CULTURE, GROUP A STREP

## 2013-06-04 ENCOUNTER — Telehealth: Payer: Self-pay | Admitting: Internal Medicine

## 2013-06-04 ENCOUNTER — Other Ambulatory Visit: Payer: Self-pay

## 2013-06-04 MED ORDER — ALPRAZOLAM 0.5 MG PO TABS: 0.5000 mg | ORAL_TABLET | Freq: Two times a day (BID) | ORAL | Status: DC

## 2013-06-04 NOTE — Telephone Encounter (Signed)
Called pt on mobile number. Mailbox is full. Left office number for call back to see what  How she is doing. We have not seen her in some time.

## 2013-06-04 NOTE — Telephone Encounter (Signed)
Patient lost job and was wondering if you may refill her Xanax

## 2013-06-04 NOTE — Telephone Encounter (Signed)
pt just lost job an was wondering if yu may refill her zanax med

## 2013-06-04 NOTE — Telephone Encounter (Signed)
20 minute conversation with patient. Terminated from job. Upset but sleeping--  Anxious. Call in Xanax 0.05mg  #60 one po bid with no refill

## 2014-05-24 ENCOUNTER — Encounter: Payer: Self-pay | Admitting: Internal Medicine

## 2014-05-24 ENCOUNTER — Ambulatory Visit
Admission: RE | Admit: 2014-05-24 | Discharge: 2014-05-24 | Disposition: A | Discharge status: Home / Self Care | Payer: No Typology Code available for payment source | Source: Ambulatory Visit | Attending: Internal Medicine | Admitting: Internal Medicine

## 2014-05-24 ENCOUNTER — Ambulatory Visit (INDEPENDENT_AMBULATORY_CARE_PROVIDER_SITE_OTHER): Payer: No Typology Code available for payment source | Admitting: Internal Medicine

## 2014-05-24 VITALS — BP 128/82 | Temp 99.7°F | Ht 63.0 in | Wt 117.5 lb

## 2014-05-24 DIAGNOSIS — R059 Cough, unspecified: Secondary | ICD-10-CM

## 2014-05-24 DIAGNOSIS — R05 Cough: Secondary | ICD-10-CM

## 2014-05-24 DIAGNOSIS — M542 Cervicalgia: Secondary | ICD-10-CM

## 2014-05-24 MED ORDER — CYCLOBENZAPRINE HCL 10 MG PO TABS: ORAL_TABLET | ORAL | Status: DC

## 2014-05-24 NOTE — Patient Instructions (Addendum)
Ice neck down for 20 minutes twice daily. Do not sit for extended periods of time at work without rolling shoulders and doing some range of motion exercises with neck. Take Flexeril as directed. Have chest x-ray

## 2014-05-24 NOTE — Progress Notes (Signed)
   Subjective:    Patient ID: Paige Lawrence, female    DOB: 1956/04/02, 58 y.o.   MRN: [id number]  HPI Not seen here since May 2013. History of heavy smoking. 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 Daughter is now 30 years old and will be a senior in high school. Patient complaining of a several day history of burning sensation right parietal area. No frank headache just burning sensation. Patient is worried about possible aneurysm. No lesions in scalp that she has noted. Today she does not have the sensation. Was daily for several days. Denies being under excessive stress.  Past medical history: History of fractured clavicle in the remote past. C-section 1980.  Intolerant of Sulfa and ibuprofen  No chronic medications  History of hyperplastic colon polyps. Had colonoscopy at [facility] in 2013 showing 4 hyperplastic polyps.  Dr. Phineas Real is GYN physician.  Social history: Completed 2 years of college. Shee is divorced. Long-term partner died several years ago the of liver disease. Her daughter was father by this man. She been married before and has a son from that marriage. Patient smokes but will not admit to how much per day.  Family history: Father with history of dementia. Mother with history of hypertension. One brother with history of coronary artery disease. Son with history of mitral valve prolapse and hypertension    Review of Systems     Objective:   Physical Exam  Chest: Clear. Pharynx is clear. TMs are clear. Neck is supple. She is very tight in both trapezius muscles bilaterally and particularly in the right sternocleidomastoid muscle. No scalp lesions noted.        Assessment & Plan:  Spasm trapezius muscles bilaterally and right sternocleidomastoid muscle  Paresthesias right scalp parietal area  Plan: Flexeril 10 mg take one half tablet by mouth each bedtime. Ice neck down for 20 minutes twice daily. Have chest x-ray. Smoking  cessation counseling given.

## 2014-09-05 ENCOUNTER — Encounter: Payer: Self-pay | Admitting: Internal Medicine

## 2015-06-12 ENCOUNTER — Telehealth: Payer: Self-pay | Admitting: Internal Medicine

## 2015-06-12 NOTE — Telephone Encounter (Signed)
Patient scheduled to be seen.

## 2015-06-12 NOTE — Telephone Encounter (Signed)
Cannot refill without OV

## 2015-06-12 NOTE — Telephone Encounter (Signed)
Pt would like refill on ALPRAZolam (XANAX) 0.5 MG tablet.   Offered pt appointment due to length of time not seen, pt declined.  States she gets her refills because PCP is out of network and does not want to pay office visit charge.  [facility], Best number to call pt is [phone].

## 2015-06-15 ENCOUNTER — Ambulatory Visit (INDEPENDENT_AMBULATORY_CARE_PROVIDER_SITE_OTHER): Payer: No Typology Code available for payment source | Admitting: Internal Medicine

## 2015-06-15 VITALS — BP 124/84 | HR 80 | Temp 98.8°F | Wt 108.0 lb

## 2015-06-15 DIAGNOSIS — F411 Generalized anxiety disorder: Secondary | ICD-10-CM

## 2015-06-15 DIAGNOSIS — Z720 Tobacco use: Secondary | ICD-10-CM | POA: Diagnosis not present

## 2015-06-15 DIAGNOSIS — Z87891 Personal history of nicotine dependence: Secondary | ICD-10-CM

## 2015-06-15 MED ORDER — ALPRAZOLAM 0.5 MG PO TABS: 0.5000 mg | ORAL_TABLET | Freq: Two times a day (BID) | ORAL | Status: DC

## 2015-08-03 ENCOUNTER — Encounter: Payer: Self-pay | Admitting: Internal Medicine

## 2015-08-03 NOTE — Patient Instructions (Signed)
Xanax refilled for 6 months for anxiety.

## 2015-08-03 NOTE — Progress Notes (Signed)
   Subjective:    Patient ID: Paige Lawrence, female    DOB: June 25, 1956, 58 y.o.   MRN: [id number]  HPI 59 year old White Female with long-standing history of smoking. History of anxiety treated with Xanax in the past. 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 She is a single-parent. Daughter is attending college at [employer]. This is her first year. Patient having issues with anxiety.    Review of Systems as above     Objective:   Physical Exam  Spent 15 minutes speaking with patient about anxiety symptoms. Not examined.      Assessment & Plan:  Anxiety  History of smoking  Plan: Refill Xanax for 6 months. Needs to have physical exam in the near future.

## 2016-03-15 ENCOUNTER — Encounter: Payer: Self-pay | Admitting: Internal Medicine

## 2016-03-15 ENCOUNTER — Ambulatory Visit (INDEPENDENT_AMBULATORY_CARE_PROVIDER_SITE_OTHER): Payer: BLUE CROSS/BLUE SHIELD | Admitting: Internal Medicine

## 2016-03-15 VITALS — BP 130/76 | HR 92 | Temp 97.6°F | Resp 18 | Wt 109.0 lb

## 2016-03-15 DIAGNOSIS — W57XXXA Bitten or stung by nonvenomous insect and other nonvenomous arthropods, initial encounter: Secondary | ICD-10-CM

## 2016-03-15 DIAGNOSIS — T148 Other injury of unspecified body region: Secondary | ICD-10-CM

## 2016-03-15 DIAGNOSIS — S30861A Insect bite (nonvenomous) of abdominal wall, initial encounter: Secondary | ICD-10-CM

## 2016-03-15 NOTE — Patient Instructions (Signed)
Clean tick bite with peroxide and apply Polysporin ointment twice daily until healed. If itching occurs, treat with 1% hydrocortisone cream. To  call if develops fever headache or rash.

## 2016-03-15 NOTE — Progress Notes (Signed)
   Subjective:    Patient ID: Paige Lawrence, female    DOB: 1956-10-20, 60 y.o.   MRN: [id number]  HPI  60 year old White Female in today with tick bite on abdomen. Tick still in place. Says she and her daughter were unable to remove it. Doesn't know when tick bit her. Just found it today. It was itching.    Review of Systems as above     Objective:   Physical Exam  Small tick embedded in mid abdomen removed with forceps. Surrounding erythema noted. No drainage.      Assessment & Plan:  Tick bite of abdomen  Tick removal  Plan: Cleaned with peroxide twice daily and apply Polysporin ointment twice daily until healed. Call if develops fever or headache or rash in next 2 weeks. May use hydrocortisone cream if itching persists.

## 2016-09-02 ENCOUNTER — Ambulatory Visit (INDEPENDENT_AMBULATORY_CARE_PROVIDER_SITE_OTHER): Payer: BLUE CROSS/BLUE SHIELD | Admitting: Internal Medicine

## 2016-09-02 ENCOUNTER — Encounter: Payer: Self-pay | Admitting: Internal Medicine

## 2016-09-02 VITALS — BP 126/76 | HR 82 | Temp 97.7°F | Ht 63.0 in | Wt 109.0 lb

## 2016-09-02 DIAGNOSIS — R002 Palpitations: Secondary | ICD-10-CM | POA: Diagnosis not present

## 2016-09-02 DIAGNOSIS — K219 Gastro-esophageal reflux disease without esophagitis: Secondary | ICD-10-CM

## 2016-09-02 DIAGNOSIS — K580 Irritable bowel syndrome with diarrhea: Secondary | ICD-10-CM | POA: Diagnosis not present

## 2016-09-02 DIAGNOSIS — Z87891 Personal history of nicotine dependence: Secondary | ICD-10-CM | POA: Diagnosis not present

## 2016-09-02 MED ORDER — ALPRAZOLAM 0.5 MG PO TABS: 0.5000 mg | ORAL_TABLET | Freq: Two times a day (BID) | ORAL | 5 refills | Status: DC
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
## 2016-09-02 NOTE — Patient Instructions (Signed)
Xanax 0.5 mg twice daily as needed for anxiety and irritable bowel issues. Restart Nexium over-the-counter daily. Please stop smoking.

## 2016-09-06 ENCOUNTER — Ambulatory Visit (INDEPENDENT_AMBULATORY_CARE_PROVIDER_SITE_OTHER): Payer: BLUE CROSS/BLUE SHIELD | Admitting: Gynecology

## 2016-09-06 ENCOUNTER — Ambulatory Visit: Payer: BLUE CROSS/BLUE SHIELD | Admitting: Gynecology

## 2016-09-06 ENCOUNTER — Encounter: Payer: Self-pay | Admitting: Gynecology

## 2016-09-06 VITALS — BP 112/70 | Ht 63.0 in | Wt 109.0 lb

## 2016-09-06 DIAGNOSIS — M8580 Other specified disorders of bone density and structure, unspecified site: Secondary | ICD-10-CM

## 2016-09-06 DIAGNOSIS — Z01419 Encounter for gynecological examination (general) (routine) without abnormal findings: Secondary | ICD-10-CM | POA: Diagnosis not present

## 2016-09-06 DIAGNOSIS — N952 Postmenopausal atrophic vaginitis: Secondary | ICD-10-CM

## 2016-09-06 NOTE — Patient Instructions (Signed)
Follow up for your bone density as scheduled.  Call to Schedule your mammogram  Facilities in [place]: 1)  [facility]. [facility], [address] Phone: [phone] 2)  Dr. Bertrand at [facility]  [address] Phone: [phone]     Mammogram A mammogram is an X-ray test to find changes in a woman's breast. You should get a mammogram if:  You are 60 years of age or older  You have risk factors.   Your doctor recommends that you have one.  BEFORE THE TEST  Do not schedule the test the week before your period, especially if your breasts are sore during this time.  On the day of your mammogram:  Wash your breasts and armpits well. After washing, do not put on any deodorant or talcum powder on until after your test.   Eat and drink as you usually do.   Take your medicines as usual.   If you are diabetic and take insulin, make sure you:   Eat before coming for your test.   Take your insulin as usual.   If you cannot keep your appointment, call before the appointment to cancel. Schedule another appointment.  TEST  You will need to undress from the waist up. You will put on a hospital gown.   Your breast will be put on the mammogram machine, and it will press firmly on your breast with a piece of plastic called a compression paddle. This will make your breast flatter so that the machine can X-ray all parts of your breast.   Both breasts will be X-rayed. Each breast will be X-rayed from above and from the side. An X-ray might need to be taken again if the picture is not good enough.   The mammogram will last about 15 to 30 minutes.  AFTER THE TEST Finding out the results of your test Ask when your test results will be ready. Make sure you get your test results.  Document Released: 01/17/2009 Document Revised: 10/10/2011 Document Reviewed: 01/17/2009 ExitCare Patient Information 2012 [employer].   

## 2016-09-06 NOTE — Progress Notes (Signed)
    Paige Lawrence 08/25/1956 PO:[id number]        60 y.o.  G2P2  for annual exam.  Has not been in the office since 2012. Is without GYN complaints.  Past medical history,surgical history, problem list, medications, allergies, family history and social history were all reviewed and documented as reviewed in the EPIC chart.  ROS:  Performed with pertinent positives and negatives included in the history, assessment and plan.   Additional significant findings :  None   Exam: Caryn Bee assistant Vitals:   09/06/16 1517  BP: 112/70  Weight: 109 lb (49.4 kg)  Height: 5\' 3"  (1.6 m)   Body mass index is 19.31 kg/m.  General appearance:  Normal affect, orientation and appearance. Skin: Grossly normal HEENT: Without gross lesions.  No cervical or supraclavicular adenopathy. Thyroid normal.  Lungs:  Clear without wheezing, rales or rhonchi Cardiac: RR, without RMG Abdominal:  Soft, nontender, without masses, guarding, rebound, organomegaly or hernia Breasts:  Examined lying and sitting without masses, retractions, discharge or axillary adenopathy. Pelvic:  Ext, BUS, Vagina with atrophic changes  Cervix with atrophic changes. Pap smear done  Uterus anteverted, normal size, shape and contour, midline and mobile nontender   Adnexa without masses or tenderness    Anus and perineum normal   Rectovaginal normal sphincter tone without palpated masses or tenderness.    Assessment/Plan:  60 y.o. G2P2 female for annual exam.   1. Postmenopausal/atrophic genital changes. No significant hot flushes, night sweats, vaginal dryness or any vaginal bleeding. Continue to monitor report any issues or bleeding. 2. Osteopenia. DEXA 2011 T score -2.2. Recommend repeat DEXA now. Patient appears reluctant to schedule and I emphasized the need to do so so that if she does have significant loss we can initiate treatment early. Patient understands importance of scheduling. Increase vitamin D and calcium  reviewed. 3. Colonoscopy 2013. Repeat at their recommended interval. 4. Pap smear 2012. Pap smear done today. No history of significant abnormal Pap smears previously. 5. Mammography 2013. I stressed the need to schedule a screening mammogram. Benefits of early detection reviewed. SBE monthly reviewed. 6. Health maintenance. Actively being seen by Dr. Renold Genta. No routine lab work done as she will have this done through her office. Follow up for DEXA otherwise 1 year, sooner as needed.   Anastasio Auerbach MD, 3:45 PM 09/06/2016

## 2016-09-06 NOTE — Addendum Note (Signed)
Addended by: Nelva Nay on: 09/06/2016 03:52 PM   Modules accepted: Orders

## 2016-09-10 LAB — PAP IG W/ RFLX HPV ASCU

## 2017-05-09 ENCOUNTER — Telehealth: Payer: Self-pay

## 2017-05-09 DIAGNOSIS — Z1239 Encounter for other screening for malignant neoplasm of breast: Secondary | ICD-10-CM

## 2017-05-09 NOTE — Telephone Encounter (Signed)
Order for mammogram placed and letter sent

## 2017-10-06 ENCOUNTER — Encounter: Payer: Self-pay | Admitting: Internal Medicine

## 2018-09-21 ENCOUNTER — Telehealth: Payer: Self-pay | Admitting: *Deleted

## 2018-09-21 ENCOUNTER — Ambulatory Visit: Payer: BLUE CROSS/BLUE SHIELD | Admitting: Gynecology

## 2018-09-21 ENCOUNTER — Encounter: Payer: Self-pay | Admitting: Gynecology

## 2018-09-21 VITALS — BP 118/74 | Ht 63.0 in | Wt 118.0 lb

## 2018-09-21 DIAGNOSIS — N644 Mastodynia: Secondary | ICD-10-CM

## 2018-09-21 NOTE — Telephone Encounter (Signed)
Orders placed at the breast center they will call to schedule.

## 2018-09-21 NOTE — Progress Notes (Signed)
    Paige Lawrence 09-29-1956 [id number]        62 y.o.  G2P2 with 1 week history of left breast tenderness in the tail of Spence region.  No definitive masses palpated on self breast exam.  Last mammography a number of years ago.  No nipple discharge.  Past medical history,surgical history, problem list, medications, allergies, family history and social history were all reviewed and documented in the EPIC chart.  Directed ROS with pertinent positives and negatives documented in the history of present illness/assessment and plan.  Exam: Caryn Bee assistant Vitals:   09/21/18 1442  BP: 118/74  Weight: 118 lb (53.5 kg)  Height: 5\' 3"  (1.6 m)   General appearance:  Normal Both breasts examined lying and sitting without masses, retractions, discharge, adenopathy.    Assessment/Plan:  62 y.o. G2P2 with 1 week history of left tail of Spence discomfort.  No palpable abnormalities on exam.  Recommend diagnostic mammography and ultrasound.  Patient knows that we will make arrangements for this and if she does not hear from the mammogram facility within the week she will call.    Anastasio Auerbach MD, 2:56 PM 09/21/2018

## 2018-09-21 NOTE — Patient Instructions (Signed)
The breast center will call you to arrange for the mammogram and ultrasound of the breast.  If you do not hear from them  this week call my office.

## 2018-09-21 NOTE — Telephone Encounter (Signed)
-----   Message from Anastasio Auerbach, MD sent at 09/21/2018  2:56 PM EST ----- Arrange for diagnostic mammography and ultrasound left breast reference new onset left tail of Spence pain.  Physician exam is negative.

## 2018-09-22 NOTE — Telephone Encounter (Signed)
Patient scheduled at breast center 09/25/18 @ 11:50pm

## 2018-09-25 ENCOUNTER — Ambulatory Visit
Admission: RE | Admit: 2018-09-25 | Discharge: 2018-09-25 | Disposition: A | Discharge status: Home / Self Care | Payer: No Typology Code available for payment source | Source: Ambulatory Visit | Attending: Gynecology | Admitting: Gynecology

## 2018-09-25 ENCOUNTER — Ambulatory Visit
Admission: RE | Admit: 2018-09-25 | Discharge: 2018-09-25 | Disposition: A | Discharge status: Home / Self Care | Payer: BLUE CROSS/BLUE SHIELD | Source: Ambulatory Visit | Attending: Gynecology | Admitting: Gynecology

## 2018-09-25 DIAGNOSIS — N644 Mastodynia: Secondary | ICD-10-CM

## 2018-11-16 ENCOUNTER — Ambulatory Visit: Payer: BLUE CROSS/BLUE SHIELD | Admitting: Gynecology

## 2018-11-16 ENCOUNTER — Encounter: Payer: Self-pay | Admitting: Gynecology

## 2018-11-16 VITALS — BP 118/76 | Ht 63.0 in | Wt 121.0 lb

## 2018-11-16 DIAGNOSIS — Z9189 Other specified personal risk factors, not elsewhere classified: Secondary | ICD-10-CM | POA: Diagnosis not present

## 2018-11-16 DIAGNOSIS — M8580 Other specified disorders of bone density and structure, unspecified site: Secondary | ICD-10-CM

## 2018-11-16 DIAGNOSIS — Z13220 Encounter for screening for lipoid disorders: Secondary | ICD-10-CM | POA: Diagnosis not present

## 2018-11-16 DIAGNOSIS — Z01419 Encounter for gynecological examination (general) (routine) without abnormal findings: Secondary | ICD-10-CM

## 2018-11-16 NOTE — Addendum Note (Signed)
Addended by: Nelva Nay on: 11/16/2018 04:19 PM   Modules accepted: Orders

## 2018-11-16 NOTE — Patient Instructions (Signed)
Follow-up for the bone density as scheduled.  Follow-up for the fasting blood work

## 2018-11-16 NOTE — Progress Notes (Signed)
    Paige Lawrence Dec 02, 1955 [id number]        63 y.o.  G2P2 for annual gynecologic exam.  Without gynecologic complaints  Past medical history,surgical history, problem list, medications, allergies, family history and social history were all reviewed and documented as reviewed in the EPIC chart.  ROS:  Performed with pertinent positives and negatives included in the history, assessment and plan.   Additional significant findings : None   Exam: Caryn Bee assistant Vitals:   11/16/18 1523  BP: 118/76  Weight: 121 lb (54.9 kg)  Height: 5\' 3"  (1.6 m)   Body mass index is 21.43 kg/m.  General appearance:  Normal affect, orientation and appearance. Skin: Grossly normal HEENT: Without gross lesions.  No cervical or supraclavicular adenopathy. Thyroid normal.  Lungs:  Clear without wheezing, rales or rhonchi Cardiac: RR, without RMG Abdominal:  Soft, nontender, without masses, guarding, rebound, organomegaly or hernia Breasts:  Examined lying and sitting without masses, retractions, discharge or axillary adenopathy. Pelvic:  Ext, BUS, Vagina: Normal with atrophic changes  Cervix: Normal with atrophic changes.  Pap smear done  Uterus: Anteverted, normal size, shape and contour, midline and mobile nontender   Adnexa: Without masses or tenderness    Anus and perineum: Normal   Rectovaginal: Normal sphincter tone without palpated masses or tenderness.    Assessment/Plan:  63 y.o. G2P2 female for annual gynecologic exam.   1. Postmenopausal.  No significant menopausal symptoms or any vaginal bleeding. 2. Osteopenia.  DEXA 2011 T score -2.2 patient has failed to follow-up for repeat DEXA as recommended.  I again strongly recommended she schedule a follow-up DEXA.  She is at risk for osteoporosis given her Caucasian, thin, smoking history.  Order placed and patient agrees to schedule.  Check baseline TSH and vitamin D. 3. Mammography 09/2018.  Evaluated for mastalgia.  Mammogram and  ultrasound were negative.  Discomfort comes and goes but does not persist.  No palpable abnormalities on self breast exam.  Physician exam is normal.  Patient will schedule follow-up mammogram when due for screening.  Will report any persistent pain or any palpable abnormalities. 4. Pap smear 2017.  Pap smear done today.  No history of abnormal Pap smears previously. 5. Colonoscopy 2013.  Repeat at their recommended interval. 6. Health maintenance.  Future orders placed for CBC, CMP, lipid profile, TSH and vitamin D.  Follow-up in 1 year, sooner as needed.   Anastasio Auerbach MD, 3:58 PM 11/16/2018

## 2018-11-17 LAB — PAP IG W/ RFLX HPV ASCU

## 2019-08-03 ENCOUNTER — Encounter: Payer: Self-pay | Admitting: Gynecology

## 2019-11-22 ENCOUNTER — Encounter: Payer: BLUE CROSS/BLUE SHIELD | Admitting: Gynecology

## 2019-11-30 ENCOUNTER — Other Ambulatory Visit: Payer: Self-pay

## 2019-12-01 ENCOUNTER — Encounter: Payer: Self-pay | Admitting: Obstetrics and Gynecology

## 2019-12-01 ENCOUNTER — Ambulatory Visit (INDEPENDENT_AMBULATORY_CARE_PROVIDER_SITE_OTHER): Payer: 59 | Admitting: Obstetrics and Gynecology

## 2019-12-01 VITALS — BP 122/76 | Ht 63.0 in | Wt 123.0 lb

## 2019-12-01 DIAGNOSIS — Z9189 Other specified personal risk factors, not elsewhere classified: Secondary | ICD-10-CM | POA: Diagnosis not present

## 2019-12-01 DIAGNOSIS — Z01419 Encounter for gynecological examination (general) (routine) without abnormal findings: Secondary | ICD-10-CM

## 2019-12-01 NOTE — Patient Instructions (Addendum)
It was nice to meet you! Please remember to schedule your bone density scan so we can follow up how things are going from the last time you had that done. Please remember your yearly mammogram.

## 2019-12-01 NOTE — Progress Notes (Signed)
   Paige Lawrence [birth year] VO:[id number]  SUBJECTIVE:  64 y.o. G2P2 female for annual routine gynecologic exam and Pap smear. She has no gynecologic concerns.   The breast pain that she had last year has mostly subsided.   Current Outpatient Medications  Medication Sig Dispense Refill  . ALPRAZolam (XANAX) 0.5 MG tablet Take 1 tablet (0.5 mg total) by mouth 2 (two) times daily. 60 tablet 5  . calcium carbonate (TUMS - DOSED IN MG ELEMENTAL CALCIUM) 500 MG chewable tablet Chew 1 tablet by mouth daily.    Marland Kitchen esomeprazole (NEXIUM) 40 MG capsule Take 40 mg by mouth daily before breakfast.       No current facility-administered medications for this visit.   Allergies: Aspirin, Ibuprofen, and Sulfa antibiotics  Patient's last menstrual period was 11/04/1996.  Past medical history,surgical history, problem list, medications, allergies, family history and social history were all reviewed and documented as reviewed in the EPIC chart.  ROS:  Feeling well. No dyspnea or chest pain on exertion.  No abdominal pain, change in bowel habits, black or bloody stools.  No urinary tract symptoms. GYN ROS:  no abnormal bleeding, pelvic pain or discharge, no breast pain or new or enlarging lumps on self exam. No neurological complaints.    OBJECTIVE:  BP 122/76   Ht 5\' 3"  (1.6 m)   Wt 123 lb (55.8 kg)   LMP 11/04/1996   BMI 21.79 kg/m  The patient appears well, alert, oriented x 3, in no distress. ENT normal.  Neck supple. No cervical or supraclavicular adenopathy or thyromegaly.  Lungs are clear, good air entry, no wheezes, rhonchi or rales. S1 and S2 normal, no murmurs, regular rate and rhythm.  Abdomen soft without tenderness, guarding, mass or organomegaly.  Neurological is normal, no focal findings.  BREAST EXAM: breasts appear normal, no suspicious masses, no skin or nipple changes or axillary nodes  PELVIC EXAM: VULVA: normal appearing vulva with no masses, tenderness or lesions, VAGINA:  normal appearing vagina with normal color and discharge, no lesions, CERVIX: normal appearing cervix without discharge or lesions, UTERUS: uterus is normal size, shape, consistency and nontender, ADNEXA: normal adnexa in size, nontender and no masses.  Chaperone: Caryn Bee present during the examination  ASSESSMENT:  64 y.o. G2P2 here for annual gynecologic exam  PLAN:   1. Postmenopausal.  No gynecologic concerns at this time. 2. Pap smear 11/2018. Not repeated today. No prior history of abnormal Pap smears. Next Pap smear due 2023 following the current screening guidelines calling for the 3-year interval. 3. Mammogram 09/2018. Will continue with annual mammography. Breast exam normal today. 4. Colonoscopy 2013. Recommended that she continue per the prescribed interval. 5. Osteopenia. Last DEXA 2011.  She has not followed up for a DEXA despite recommendations to do so.  This is recommended to her again, especially with her risk factors for osteoporosis.   6. Health maintenance.  She will proceed to schedule fasting labs for another day for a CBC, CMP, lipid profile, TSH, and vitamin D.  The labs are ordered.  Return annually or sooner, prn.  Larey Days MD  12/01/19

## 2020-06-13 ENCOUNTER — Ambulatory Visit (INDEPENDENT_AMBULATORY_CARE_PROVIDER_SITE_OTHER): Payer: 59 | Admitting: Nurse Practitioner

## 2020-06-13 ENCOUNTER — Encounter: Payer: Self-pay | Admitting: Nurse Practitioner

## 2020-06-13 ENCOUNTER — Encounter (HOSPITAL_COMMUNITY): Payer: Self-pay | Admitting: Emergency Medicine

## 2020-06-13 ENCOUNTER — Other Ambulatory Visit: Payer: Self-pay

## 2020-06-13 VITALS — BP 118/78

## 2020-06-13 DIAGNOSIS — R1031 Right lower quadrant pain: Secondary | ICD-10-CM | POA: Insufficient documentation

## 2020-06-13 DIAGNOSIS — R339 Retention of urine, unspecified: Secondary | ICD-10-CM | POA: Diagnosis not present

## 2020-06-13 DIAGNOSIS — F17210 Nicotine dependence, cigarettes, uncomplicated: Secondary | ICD-10-CM | POA: Insufficient documentation

## 2020-06-13 DIAGNOSIS — R309 Painful micturition, unspecified: Secondary | ICD-10-CM

## 2020-06-13 DIAGNOSIS — K5732 Diverticulitis of large intestine without perforation or abscess without bleeding: Secondary | ICD-10-CM | POA: Insufficient documentation

## 2020-06-13 DIAGNOSIS — R1032 Left lower quadrant pain: Secondary | ICD-10-CM | POA: Diagnosis not present

## 2020-06-13 LAB — COMPREHENSIVE METABOLIC PANEL
ALT: 13 U/L (ref 0–44)
AST: 16 U/L (ref 15–41)
Albumin: 4.1 g/dL (ref 3.5–5.0)
Alkaline Phosphatase: 103 U/L (ref 38–126)
Anion gap: 9 (ref 5–15)
BUN: 9 mg/dL (ref 8–23)
CO2: 23 mmol/L (ref 22–32)
Calcium: 9.1 mg/dL (ref 8.9–10.3)
Chloride: 105 mmol/L (ref 98–111)
Creatinine, Ser: 0.72 mg/dL (ref 0.44–1.00)
GFR calc Af Amer: >60 mL/min (ref >60)
GFR calc non Af Amer: >60 mL/min (ref >60)
Glucose, Bld: 102 mg/dL — ABNORMAL HIGH (ref 70–99)
Potassium: 3.8 mmol/L (ref 3.5–5.1)
Sodium: 137 mmol/L (ref 135–145)
Total Bilirubin: 1.2 mg/dL (ref 0.3–1.2)
Total Protein: 7.4 g/dL (ref 6.5–8.1)

## 2020-06-13 LAB — URINALYSIS, ROUTINE W REFLEX MICROSCOPIC
Bilirubin Urine: NEGATIVE
Glucose, UA: NEGATIVE mg/dL
Ketones, ur: 5 mg/dL — AB
Nitrite: NEGATIVE
Protein, ur: NEGATIVE mg/dL
Specific Gravity, Urine: 1.008 (ref 1.005–1.030)
pH: 6 (ref 5.0–8.0)

## 2020-06-13 LAB — LIPASE, BLOOD: Lipase: 25 U/L (ref 11–51)

## 2020-06-13 LAB — CBC
HCT: 44.9 % (ref 36.0–46.0)
Hemoglobin: 15.5 g/dL — ABNORMAL HIGH (ref 12.0–15.0)
MCH: 34.4 pg — ABNORMAL HIGH (ref 26.0–34.0)
MCHC: 34.5 g/dL (ref 30.0–36.0)
MCV: 99.8 fL (ref 80.0–100.0)
Platelets: 231 10*3/uL (ref 150–400)
RBC: 4.5 MIL/uL (ref 3.87–5.11)
RDW: 13 % (ref 11.5–15.5)
WBC: 13.6 10*3/uL — ABNORMAL HIGH (ref 4.0–10.5)
nRBC: 0 % (ref 0.0–0.2)

## 2020-06-13 NOTE — ED Triage Notes (Signed)
Sent by OB/GYN, patient has LLQ abd pain since yesterday evening, pain w/urination. NP wanted to R/U diverticulitis. Denies CP, N/V/D.

## 2020-06-13 NOTE — Progress Notes (Signed)
   Acute Office Visit  Subjective:    Patient ID: Paige Lawrence, female    DOB: 1956/03/01, 64 y.o.   MRN: [id number]   HPI 64 y.o. presents today for severe LLQ pain that began yesterday. She describes the pain as sharp/cramping, constant but worse with urination, nothing relieves it. LBM yesterday and describes it as normal. History of diverticulosis. Denies urinary or vaginal symptoms.    Review of Systems  Constitutional: Negative.   Gastrointestinal: Positive for abdominal pain (LLQ). Negative for blood in stool, constipation, diarrhea, nausea and vomiting.  Genitourinary: Negative for dysuria, flank pain, frequency, hematuria, urgency, vaginal bleeding and vaginal discharge.       Objective:    Physical Exam Constitutional:      General: She is in acute distress.  Abdominal:     General: There is no distension.     Palpations: There is no mass.     Tenderness: There is abdominal tenderness (LLQ with palpation). There is no guarding or rebound.  Genitourinary:    General: Normal vulva.     BP 118/78   LMP 11/04/1996  Wt Readings from Last 3 Encounters:  12/01/19 123 lb (55.8 kg)  11/16/18 121 lb (54.9 kg)  09/21/18 118 lb (53.5 kg)        Assessment & Plan:   Problem List Items Addressed This Visit    None    Visit Diagnoses    Left lower quadrant abdominal pain    -  Primary   Pain with urination       Relevant Orders   Urinalysis,Complete w/RFL Culture     Plan: UA unremarkable, normal GYN exam. Based on symptoms and personal history of diverticulosis I am suspicious of diverticulitis. She is in severe pain during visit. Recommend emergency department for GI workup. Daughter is here and will transport patient. She is agreeable to plan. ED called and is aware of patient arrival.      Tamela Gammon [facility], 12:25 PM 06/13/2020

## 2020-06-14 ENCOUNTER — Emergency Department (HOSPITAL_COMMUNITY): Payer: 59

## 2020-06-14 ENCOUNTER — Emergency Department (HOSPITAL_COMMUNITY)
Admission: EM | Admit: 2020-06-14 | Discharge: 2020-06-14 | Disposition: A | Discharge status: Home / Self Care | Payer: 59 | Attending: Emergency Medicine | Admitting: Emergency Medicine

## 2020-06-14 ENCOUNTER — Encounter (HOSPITAL_COMMUNITY): Payer: Self-pay

## 2020-06-14 DIAGNOSIS — K5792 Diverticulitis of intestine, part unspecified, without perforation or abscess without bleeding: Secondary | ICD-10-CM

## 2020-06-14 DIAGNOSIS — R339 Retention of urine, unspecified: Secondary | ICD-10-CM

## 2020-06-14 MED ORDER — METRONIDAZOLE 500 MG PO TABS
500.0000 mg | ORAL_TABLET | Freq: Two times a day (BID) | ORAL | 0 refills | Status: DC
Start: 2020-06-14 — End: 2020-06-17

## 2020-06-14 MED ORDER — HYDROCODONE-ACETAMINOPHEN 5-325 MG PO TABS: 1.0000 | ORAL_TABLET | ORAL | 0 refills | Status: DC | PRN

## 2020-06-14 MED ORDER — SODIUM CHLORIDE 0.9 % IV BOLUS
1000.0000 mL | Freq: Once | INTRAVENOUS | Status: AC
  Administered 2020-06-14: 1000 mL via INTRAVENOUS

## 2020-06-14 MED ORDER — SODIUM CHLORIDE 0.9 % IV SOLN
2.0000 g | Freq: Once | INTRAVENOUS | Status: AC
  Administered 2020-06-14: 2 g via INTRAVENOUS
  Filled 2020-06-14: qty 20

## 2020-06-14 MED ORDER — ONDANSETRON HCL 4 MG/2ML IJ SOLN
4.0000 mg | Freq: Once | INTRAMUSCULAR | Status: DC
  Filled 2020-06-14: qty 2

## 2020-06-14 MED ORDER — CIPROFLOXACIN HCL 500 MG PO TABS
500.0000 mg | ORAL_TABLET | Freq: Two times a day (BID) | ORAL | 0 refills | Status: DC
Start: 2020-06-14 — End: 2020-06-17

## 2020-06-14 MED ORDER — METRONIDAZOLE IN NACL 5-0.79 MG/ML-% IV SOLN
500.0000 mg | Freq: Once | INTRAVENOUS | Status: AC
  Administered 2020-06-14: 500 mg via INTRAVENOUS
  Filled 2020-06-14: qty 100

## 2020-06-14 MED ORDER — IOHEXOL 300 MG/ML  SOLN
100.0000 mL | Freq: Once | INTRAMUSCULAR | Status: AC | PRN
  Administered 2020-06-14: 100 mL via INTRAVENOUS

## 2020-06-14 MED ORDER — SODIUM CHLORIDE (PF) 0.9 % IJ SOLN
INTRAMUSCULAR | Status: AC
  Filled 2020-06-14: qty 50

## 2020-06-14 MED ORDER — MORPHINE SULFATE (PF) 4 MG/ML IV SOLN
4.0000 mg | Freq: Once | INTRAVENOUS | Status: DC
  Filled 2020-06-14: qty 1

## 2020-06-14 MED ORDER — ONDANSETRON 4 MG PO TBDP
4.0000 mg | ORAL_TABLET | Freq: Three times a day (TID) | ORAL | 0 refills | Status: DC | PRN
Start: 2020-06-14 — End: 2020-07-31

## 2020-06-14 NOTE — ED Notes (Signed)
Bladder scan done ,noted  >991ml urine volume.

## 2020-06-14 NOTE — Discharge Instructions (Addendum)
Follow up with repeat CT scan with Primary care provider  Take the antibiotics  I prescribed for pain medication and nausea medication if needed. Do not drive or operate heavy machinery or make life or death decision while taking the [place]  Follow up with Urology or your Primary care provider for your foley catheter  Return to the ED for new or worsening symptoms

## 2020-06-14 NOTE — ED Provider Notes (Signed)
[facility] DEPT Provider Note   CSN: [id number] Arrival date & time: 06/13/20  1236    History Chief Complaint  Patient presents with  . Abdominal Pain    Paige Lawrence is a 64 y.o. female with past medical history significant for DDD, MVP, spinal stenosis who presents for evaluation abdominal pain.  Patient with left lower quadrant abdominal pain onset yesterday evening.  Pain is worse when she urinates however denies dysuria.  No urinary frequency, hematuria.  Occasionally have a pain to her right lower quadrant.  Was seen by OB/GYN.  Did not have ultrasound performed.  Had pelvic exam.  Sent to the emergency department to rule out diverticulitis.  Last bowel movement yesterday.  No melena or bright blood per rectum.  Denies fever, chills, nausea, vomiting, chest pain, shortness of breath, diarrhea, lower extremity pain.  Denies additional aggravating or relieving factors.  Pain does not radiate into back.  Rates current pain a 3/10.  History obtained from patient and past medical records. No interpretor was used.  HPI     Past Medical History:  Diagnosis Date  . DDD (degenerative disc disease), lumbar   . GERD (gastroesophageal reflux disease)   . Hemorrhoids   . Herniated disc, cervical    c6-7  . Hx of spinal stenosis   . Hypoglycemic reaction   . Mitral valve prolapse   . Osteopenia    -1.7 2/05; -1.4 3/07; -2.2 7/11  . Vitamin D deficiency    26 4/10; 28 5/11    Patient Active Problem List   Diagnosis Date Noted  . COPD (chronic obstructive pulmonary disease) ([place]) 03/06/2012  . Palpitations 03/06/2012  . History of smoking 08/23/2011    Past Surgical History:  Procedure Laterality Date  . CESAREAN SECTION    . COLONOSCOPY    . WISDOM TOOTH EXTRACTION       OB History    Gravida  2   Para  2   Term      Preterm      AB      Living  2     SAB      TAB      Ectopic      Multiple      Live Births                Family History  Problem Relation Age of Onset  . Cancer Maternal Aunt        colon  . Colon cancer Maternal Aunt   . Hypertension Mother   . Colon polyps Father   . Heart disease Brother   . Diabetes Paternal Aunt   . Breast cancer Cousin        maternal cousin  . Cancer Son        Appendix    Social History   Tobacco Use  . Smoking status: Current Every Day Smoker    Packs/day: 0.50    Types: Cigarettes  . Smokeless tobacco: Never Used  Vaping Use  . Vaping Use: Never used  Substance Use Topics  . Alcohol use: Yes    Comment:  rarely  . Drug use: No    Home Medications Prior to Admission medications   Medication Sig Start Date End Date Taking? Authorizing Provider  ALPRAZolam Duanne Moron) 0.5 MG tablet Take 1 tablet (0.5 mg total) by mouth 2 (two) times daily. 09/02/16   Elby Showers, MD  calcium carbonate (TUMS - DOSED IN MG ELEMENTAL  CALCIUM) 500 MG chewable tablet Chew 1 tablet by mouth daily.    [provider]  ciprofloxacin (CIPRO) 500 MG tablet Take 1 tablet (500 mg total) by mouth every 12 (twelve) hours for 7 days. 06/14/20 06/21/20  Sarai January A, PA-C  esomeprazole (NEXIUM) 40 MG capsule Take 40 mg by mouth daily before breakfast.      [provider]  HYDROcodone-acetaminophen (NORCO/VICODIN) 5-325 MG tablet Take 1 tablet by mouth every 4 (four) hours as needed. 06/14/20   Jaylah Goodlow A, PA-C  metroNIDAZOLE (FLAGYL) 500 MG tablet Take 1 tablet (500 mg total) by mouth 2 (two) times daily for 7 days. 06/14/20 06/21/20  Mindie Rawdon A, PA-C  ondansetron (ZOFRAN ODT) 4 MG disintegrating tablet Take 1 tablet (4 mg total) by mouth every 8 (eight) hours as needed for nausea or vomiting. 06/14/20   Mikail Goostree A, PA-C    Allergies    Aspirin, Ibuprofen, and Sulfa antibiotics  Review of Systems   Review of Systems  Constitutional: Negative.   HENT: Negative.   Respiratory: Negative.   Cardiovascular: Negative.    Gastrointestinal: Positive for abdominal pain. Negative for abdominal distention, anal bleeding, blood in stool, constipation, diarrhea, nausea, rectal pain and vomiting.  Genitourinary: Negative for decreased urine volume, difficulty urinating, dysuria, flank pain, frequency, hematuria, menstrual problem, pelvic pain and urgency.  Musculoskeletal: Negative.   Skin: Negative.   Neurological: Negative.   All other systems reviewed and are negative.  Physical Exam Updated Vital Signs BP (!) 166/87 (BP Location: Right Arm)   Pulse 95   Temp 98.7 F (37.1 C) (Oral)   Resp 17   Ht 5' 3.25" (1.607 m)   Wt 56.7 kg   LMP 11/04/1996   SpO2 94%   BMI 21.97 kg/m   Physical Exam Vitals and nursing note reviewed.  Constitutional:      General: She is not in acute distress.    Appearance: She is well-developed. She is not ill-appearing, toxic-appearing or diaphoretic.  HENT:     Head: Normocephalic and atraumatic.     Mouth/Throat:     Mouth: Mucous membranes are moist.  Eyes:     Pupils: Pupils are equal, round, and reactive to light.  Cardiovascular:     Rate and Rhythm: Normal rate.     Heart sounds: Normal heart sounds.  Pulmonary:     Effort: Pulmonary effort is normal. No respiratory distress.     Breath sounds: Normal breath sounds.  Abdominal:     General: Bowel sounds are normal. There is no distension.     Palpations: Abdomen is soft.     Tenderness: There is abdominal tenderness in the left lower quadrant. There is no right CVA tenderness, left CVA tenderness, guarding or rebound. Negative signs include Murphy's sign and McBurney's sign.     Hernia: No hernia is present.  Genitourinary:    Comments: Deferred, Performed by ObGyn Musculoskeletal:        General: Normal range of motion.     Cervical back: Normal range of motion.  Skin:    General: Skin is warm and dry.     Capillary Refill: Capillary refill takes less than 2 seconds.  Neurological:     Mental Status:  She is alert.    ED Results / Procedures / Treatments   Labs (all labs ordered are listed, but only abnormal results are displayed) Labs Reviewed  COMPREHENSIVE METABOLIC PANEL - Abnormal; Notable for the following components:  Result Value   Glucose, Bld 102 (*)    All other components within normal limits  CBC - Abnormal; Notable for the following components:   WBC 13.6 (*)    Hemoglobin 15.5 (*)    MCH 34.4 (*)    All other components within normal limits  URINALYSIS, ROUTINE W REFLEX MICROSCOPIC - Abnormal; Notable for the following components:   Hgb urine dipstick SMALL (*)    Ketones, ur 5 (*)    Leukocytes,Ua SMALL (*)    Bacteria, UA RARE (*)    All other components within normal limits  URINE CULTURE  LIPASE, BLOOD    EKG None  Radiology CT Abdomen Pelvis W Contrast  Result Date: 06/14/2020 CLINICAL DATA:  64 year old female with painful urination, left lower quadrant pain. EXAM: CT ABDOMEN AND PELVIS WITH CONTRAST TECHNIQUE: Multidetector CT imaging of the abdomen and pelvis was performed using the standard protocol following bolus administration of intravenous contrast. CONTRAST:  133mL OMNIPAQUE IOHEXOL 300 MG/ML  SOLN COMPARISON:  None. FINDINGS: Lower chest: Mildly dilated distal esophagus versus small hiatal hernia. No pericardial or pleural effusion. Hepatobiliary: Negative liver. Tiny gallstone identified on coronal image 59, no pericholecystic inflammation. Pancreas: Negative. Spleen: Negative. Adrenals/Urinary Tract: Bilateral adrenal glands are within normal limits. There is already some renal contrast excretion on the early postcontrast images. Bilateral renal enhancement is symmetric and normal. No perinephric stranding. No consistent hydroureter leading into the pelvis. But the bladder is severely distended (sagittal image 79, estimated bladder volume 744 mL) with mild wall thickening and possible dependent urinary debris (series 2, image 45). Contrast is  being excreted through the left ureterovesical junction on these images. Stomach/Bowel: Negative rectum. Moderately inflamed and highly indistinct mid sigmoid colon (series 2, image 54 and coronal image 66) with underlying diverticulosis. Wall thickening and confluent sigmoid mesenteric stranding. No extraluminal gas identified. No fluid collection. Decompressed upstream descending colon. Gas-filled but nondilated transverse colon. Retained stool in the right colon. No other large bowel inflammation. Diminutive or absent appendix. No dilated small bowel. Decompressed stomach. No free air, free fluid. Vascular/Lymphatic: Calcified aortic atherosclerosis. Major arterial structures are patent. Portal venous system is patent. No lymphadenopathy. Reproductive: Small calcified uterine fibroid suspected at the dorsal body (sagittal image 71), otherwise negative. Other: No pelvic free fluid. Musculoskeletal: No acute osseous abnormality identified. IMPRESSION: 1. Highly inflamed mid sigmoid colon with underlying diverticula. Favor acute diverticulitis, with no perforation or abscess. Follow-up is recommended to exclude neoplasm which can have a similar appearance and presentation. 2. Severe distended urinary bladder (744 mL) with mild wall thickening and possible urinary debris. Suspect urinary retention secondary to #1, but superimposed UTI is possible. 3. Cholelithiasis. Aortic Atherosclerosis (ICD10-I70.0). Electronically Signed   By: Genevie Ann M.D.   On: 06/14/2020 02:42    Procedures Procedures (including critical care time)  Medications Ordered in ED Medications  morphine 4 MG/ML injection 4 mg (4 mg Intravenous Refused 06/14/20 0136)  ondansetron (ZOFRAN) injection 4 mg (4 mg Intravenous Refused 06/14/20 0137)  sodium chloride (PF) 0.9 % injection (has no administration in time range)  cefTRIAXone (ROCEPHIN) 2 g in sodium chloride 0.9 % 100 mL IVPB (2 g Intravenous New Bag/Given 06/14/20 0417)    And    metroNIDAZOLE (FLAGYL) IVPB 500 mg (has no administration in time range)  sodium chloride 0.9 % bolus 1,000 mL (1,000 mLs Intravenous New Bag/Given 06/14/20 0133)  iohexol (OMNIPAQUE) 300 MG/ML solution 100 mL (100 mLs Intravenous Contrast Given 06/14/20 0158)   ED  Course  I have reviewed the triage vital signs and the nursing notes.  Pertinent labs & imaging results that were available during my care of the patient were reviewed by me and considered in my medical decision making (see chart for details).  63 year old female presents for evaluation of left lower quadrant pain.  Began yesterday.  No diarrhea.  Last bowel movement yesterday without melena per per rectum.  She is tolerating p.o. intake without difficulty.  She is afebrile, nonseptic, non-ill-appearing.  Tenderness to left lower quadrant.  No rebound or guarding.  Seen by OB/GYN today had pelvic exam performed her concern for diverticulitis and sent here for evaluation.  Plan on labs, imaging and reassess  Labs and imaging personally reviewed and interpreted:  CBC with leukocytosis Metabolic panel without electrolyte, renal or liver normality Urine with small leuks, rare bacteria, mucus present, sent for culture Lipase 25 CTAP with inflamed colon, favor acute diverticulitis.  There is also distended bladder which could possibly be due to acute diverticulitis.  They do recommend follow-up as inflamed colon could also be neoplastic in nature.  Reassessed patient.  She has not required any pain medication or antiemetics here in the emergency department.  She feels like she is able to fully empty her bladder.  We will have her void and perform bladder scan to see if patient is retaining given CT read. Patient does not want admission at this time. I feel this is reasonable given she has not required antiemetics or analgesia here in emergency department and CT scans without abscess or perforation  Patient reassessed after void with bladder  scan at >91ml.  Patient reattempted to void and was only able to void 200cc.  Likely urinary retention due to her inflammation.  Given dose of IV abx here in the ED. Dc home with Abx. Will have her follow-up with urology for her urinary retention and PCP for diverticulitis.  The patient has been appropriately medically screened and/or stabilized in the ED. I have low suspicion for any other emergent medical condition which would require further screening, evaluation or treatment in the ED or require inpatient management.  Patient is hemodynamically stable and in no acute distress.  Patient able to ambulate in department prior to ED.  Evaluation does not show acute pathology that would require ongoing or additional emergent interventions while in the emergency department or further inpatient treatment.  I have discussed the diagnosis with the patient and answered all questions.  Pain is been managed while in the emergency department and patient has no further complaints prior to discharge.  Patient is comfortable with plan discussed in room and is stable for discharge at this time.  I have discussed strict return precautions for returning to the emergency department.  Patient was encouraged to follow-up with PCP/specialist refer to at discharge.  Discussed with attending Dr. Florina Ou who is in agreement with plan and disposition.    MDM Rules/Calculators/A&P                           Final Clinical Impression(s) / ED Diagnoses Final diagnoses:  Diverticulitis  Urinary retention    Rx / DC Orders ED Discharge Orders         Ordered    HYDROcodone-acetaminophen (NORCO/VICODIN) 5-325 MG tablet  Every 4 hours PRN     Discontinue  Reprint     06/14/20 0416    ondansetron (ZOFRAN ODT) 4 MG disintegrating tablet  Every 8  hours PRN     Discontinue  Reprint     06/14/20 0416    ciprofloxacin (CIPRO) 500 MG tablet  Every 12 hours     Discontinue  Reprint     06/14/20 0416    metroNIDAZOLE (FLAGYL)  500 MG tablet  2 times daily     Discontinue  Reprint     06/14/20 0416           Reilley Latorre A, PA-C 06/14/20 0418    Molpus, [name], MD 06/14/20 6701

## 2020-06-15 LAB — URINALYSIS, COMPLETE W/RFL CULTURE
Bilirubin Urine: NEGATIVE
Glucose, UA: NEGATIVE
Hyaline Cast: NONE SEEN /LPF
Ketones, ur: NEGATIVE
Nitrites, Initial: NEGATIVE
Protein, ur: NEGATIVE
RBC / HPF: NONE SEEN /HPF (ref 0–2)
Specific Gravity, Urine: 1.01 (ref 1.001–1.03)
pH: 5.5 (ref 5.0–8.0)

## 2020-06-15 LAB — URINE CULTURE
Culture: NO GROWTH
MICRO NUMBER:: 10808352
Result:: NO GROWTH
SPECIMEN QUALITY:: ADEQUATE

## 2020-06-15 LAB — CULTURE INDICATED

## 2020-06-16 ENCOUNTER — Telehealth: Payer: Self-pay

## 2020-06-16 ENCOUNTER — Other Ambulatory Visit: Payer: Self-pay

## 2020-06-16 DIAGNOSIS — K5732 Diverticulitis of large intestine without perforation or abscess without bleeding: Secondary | ICD-10-CM | POA: Diagnosis not present

## 2020-06-16 DIAGNOSIS — Y999 Unspecified external cause status: Secondary | ICD-10-CM | POA: Insufficient documentation

## 2020-06-16 DIAGNOSIS — F17210 Nicotine dependence, cigarettes, uncomplicated: Secondary | ICD-10-CM | POA: Diagnosis not present

## 2020-06-16 DIAGNOSIS — X58XXXA Exposure to other specified factors, initial encounter: Secondary | ICD-10-CM | POA: Insufficient documentation

## 2020-06-16 DIAGNOSIS — T368X5A Adverse effect of other systemic antibiotics, initial encounter: Secondary | ICD-10-CM | POA: Diagnosis not present

## 2020-06-16 DIAGNOSIS — Y929 Unspecified place or not applicable: Secondary | ICD-10-CM | POA: Diagnosis not present

## 2020-06-16 DIAGNOSIS — Y939 Activity, unspecified: Secondary | ICD-10-CM | POA: Insufficient documentation

## 2020-06-16 DIAGNOSIS — L259 Unspecified contact dermatitis, unspecified cause: Secondary | ICD-10-CM | POA: Insufficient documentation

## 2020-06-16 DIAGNOSIS — R21 Rash and other nonspecific skin eruption: Secondary | ICD-10-CM | POA: Diagnosis present

## 2020-06-16 DIAGNOSIS — J449 Chronic obstructive pulmonary disease, unspecified: Secondary | ICD-10-CM | POA: Insufficient documentation

## 2020-06-16 NOTE — Telephone Encounter (Addendum)
Patient was seen in our office 06/13/20 and referred to ER that day with LLQ pain/needed imaging.    CT was performed: IMPRESSION: 1. Highly inflamed mid sigmoid colon with underlying diverticula. Favor acute diverticulitis, with no perforation or abscess. Follow-up is recommended to exclude neoplasm which can have a similar appearance and presentation.  2. Severe distended urinary bladder (744 mL) with mild wall thickening and possible urinary debris. Suspect urinary retention secondary to #1, but superimposed UTI is possible.  3. Cholelithiasis. Aortic Atherosclerosis (ICD10-I70.0).  Patient called today because she was prescribed Metronidazole 500mg  bid and Cipro in ER.  She started Cipro on Wednesday with no problems.   She said she started the Metronidazole yesterday morning BID and "didn't feel good after I took the first one" in the morning. After the evening dose she noticed itching on her chest/back and this morning has a rash there.   She said she spent 20 hours in the ER and does not want to go back. Asking what she should do?

## 2020-06-16 NOTE — Telephone Encounter (Addendum)
I spoke with Dr. Dellis Filbert about this patient. Dr. Dellis Filbert felt highly unlikely reaction was to Metronidazole since just not a med that tends to create that type reaction. More likely the Cipro causing the sx. Recommend d/c the Cipro. Continue the Metronidazole. Take Benadryl and follow up with GI or PCP for recommendation. I spoke with patient and relayed Dr. Mariah Milling advice to her.  Patient has not seen GI since colonoscopy in 2013. At her request I called and scheduled her an appointment with Carl Best, NP for Monday morning at 11:30am.  She has already been in touch with [facility] and they cannot see her until Weds next week. She has appointment with them as well.

## 2020-06-17 ENCOUNTER — Emergency Department (HOSPITAL_BASED_OUTPATIENT_CLINIC_OR_DEPARTMENT_OTHER)
Admission: EM | Admit: 2020-06-17 | Discharge: 2020-06-17 | Disposition: A | Discharge status: Home / Self Care | Payer: 59 | Attending: Emergency Medicine | Admitting: Emergency Medicine

## 2020-06-17 ENCOUNTER — Encounter (HOSPITAL_BASED_OUTPATIENT_CLINIC_OR_DEPARTMENT_OTHER): Payer: Self-pay | Admitting: *Deleted

## 2020-06-17 ENCOUNTER — Other Ambulatory Visit: Payer: Self-pay

## 2020-06-17 DIAGNOSIS — K5732 Diverticulitis of large intestine without perforation or abscess without bleeding: Secondary | ICD-10-CM

## 2020-06-17 DIAGNOSIS — T3695XA Adverse effect of unspecified systemic antibiotic, initial encounter: Secondary | ICD-10-CM

## 2020-06-17 MED ORDER — AMOXICILLIN-POT CLAVULANATE 875-125 MG PO TABS
1.0000 | ORAL_TABLET | Freq: Once | ORAL | Status: AC
  Administered 2020-06-17: 1 via ORAL
  Filled 2020-06-17: qty 1

## 2020-06-17 MED ORDER — AMOXICILLIN-POT CLAVULANATE 875-125 MG PO TABS
1.0000 | ORAL_TABLET | Freq: Two times a day (BID) | ORAL | 0 refills | Status: DC
Start: 2020-06-17 — End: 2020-07-31

## 2020-06-17 NOTE — ED Provider Notes (Signed)
[facility] EMERGENCY DEPARTMENT Provider Note   CSN: [id number] Arrival date & time: 06/16/20  2354   History Chief Complaint  Patient presents with  . Rash    Paige Lawrence is a 64 y.o. female.  The history is provided by the patient.  Rash She started breaking out in a rash this morning.  Rash started on her back, but has spread to her abdomen, chest, legs.  It is pruritic.  She took a dose of Xyzal, but continues to have spread of the rash and itching.  She denies any difficulty breathing or swallowing.  She had been started on ciprofloxacin and metronidazole 2 days ago to treat diverticulitis.  Of note, she does feel that her abdominal pain has improved.  Past Medical History:  Diagnosis Date  . DDD (degenerative disc disease), lumbar   . GERD (gastroesophageal reflux disease)   . Hemorrhoids   . Herniated disc, cervical    c6-7  . Hx of spinal stenosis   . Hypoglycemic reaction   . Mitral valve prolapse   . Osteopenia    -1.7 2/05; -1.4 3/07; -2.2 7/11  . Vitamin D deficiency    26 4/10; 28 5/11    Patient Active Problem List   Diagnosis Date Noted  . COPD (chronic obstructive pulmonary disease) ([place]) 03/06/2012  . Palpitations 03/06/2012  . History of smoking 08/23/2011    Past Surgical History:  Procedure Laterality Date  . CESAREAN SECTION    . COLONOSCOPY    . WISDOM TOOTH EXTRACTION       OB History    Gravida  2   Para  2   Term      Preterm      AB      Living  2     SAB      TAB      Ectopic      Multiple      Live Births              Family History  Problem Relation Age of Onset  . Cancer Maternal Aunt        colon  . Colon cancer Maternal Aunt   . Hypertension Mother   . Colon polyps Father   . Heart disease Brother   . Diabetes Paternal Aunt   . Breast cancer Cousin        maternal cousin  . Cancer Son        Appendix    Social History   Tobacco Use  . Smoking status: Current Every Day Smoker      Packs/day: 0.50    Types: Cigarettes  . Smokeless tobacco: Never Used  Vaping Use  . Vaping Use: Never used  Substance Use Topics  . Alcohol use: Yes    Comment:  rarely  . Drug use: No    Home Medications Prior to Admission medications   Medication Sig Start Date End Date Taking? Authorizing Provider  ALPRAZolam Duanne Moron) 0.5 MG tablet Take 1 tablet (0.5 mg total) by mouth 2 (two) times daily. 09/02/16   Elby Showers, MD  calcium carbonate (TUMS - DOSED IN MG ELEMENTAL CALCIUM) 500 MG chewable tablet Chew 1 tablet by mouth daily.    [provider]  ciprofloxacin (CIPRO) 500 MG tablet Take 1 tablet (500 mg total) by mouth every 12 (twelve) hours for 7 days. 06/14/20 06/21/20  Henderly, Britni A, PA-C  esomeprazole (NEXIUM) 40 MG capsule Take 40 mg by mouth  daily before breakfast.      [provider]  HYDROcodone-acetaminophen (NORCO/VICODIN) 5-325 MG tablet Take 1 tablet by mouth every 4 (four) hours as needed. 06/14/20   Henderly, Britni A, PA-C  metroNIDAZOLE (FLAGYL) 500 MG tablet Take 1 tablet (500 mg total) by mouth 2 (two) times daily for 7 days. 06/14/20 06/21/20  Henderly, Britni A, PA-C  ondansetron (ZOFRAN ODT) 4 MG disintegrating tablet Take 1 tablet (4 mg total) by mouth every 8 (eight) hours as needed for nausea or vomiting. 06/14/20   Henderly, Britni A, PA-C    Allergies    Aspirin, Ibuprofen, and Sulfa antibiotics  Review of Systems   Review of Systems  Skin: Positive for rash.  All other systems reviewed and are negative.   Physical Exam Updated Vital Signs BP 128/70 (BP Location: Left Arm)   Pulse 83   Temp 99.1 F (37.3 C) (Oral)   Resp 16   Ht 5\' 3"  (1.6 m)   Wt 55.7 kg   LMP 11/04/1996   SpO2 99%   BMI 21.77 kg/m   Physical Exam Vitals and nursing note reviewed.   64 year old female, resting comfortably and in no acute distress. Vital signs are normal. Oxygen saturation is 99%, which is normal. Head is normocephalic and  atraumatic. PERRLA, EOMI. Oropharynx is clear. Neck is nontender and supple without adenopathy or JVD. Back is nontender and there is no CVA tenderness. Lungs are clear without rales, wheezes, or rhonchi. Chest is nontender. Heart has regular rate and rhythm without murmur. Abdomen is soft, flat, nontender without masses or hepatosplenomegaly and peristalsis is normoactive. Extremities have no cyanosis or edema, full range of motion is present. Skin is warm and dry.  Maculopapular rash is present over the trunk and proximal legs and pattern strongly suggestive of drug allergy. Neurologic: Mental status is normal, cranial nerves are intact, there are no motor or sensory deficits.   ED Results / Procedures / Treatments    Procedures Procedures  Medications Ordered in ED Medications  amoxicillin-clavulanate (AUGMENTIN) 875-125 MG per tablet 1 tablet (has no administration in time range)    ED Course  I have reviewed the triage vital signs and the nursing notes.  MDM Rules/Calculators/A&P Allergic rash, most likely to ciprofloxacin, but possibly just metronidazole.  Old records are reviewed confirming recent ED visit with diagnosis of diverticulitis and prescription for ciprofloxacin and metronidazole.  I discussed with patient option of watchful waiting to see if diverticulitis symptoms return versus switching immediately to a different antibiotic.  She prefers to continue on antibiotics.  She is given a dose of amoxicillin-clavulanic acid and is discharged with a prescription for same.  Advised to continue using her Xyzal, and supplement with diphenhydramine as needed.  Follow-up with her primary care provider.  Final Clinical Impression(s) / ED Diagnoses Final diagnoses:  Allergic reaction due to antibacterial drug  Diverticulitis, colon    Rx / DC Orders ED Discharge Orders         Ordered    amoxicillin-clavulanate (AUGMENTIN) 875-125 MG tablet  2 times daily     Discontinue   Reprint     06/17/20 0938           Delora Fuel, MD 18/29[birth year]93 [phone]

## 2020-06-17 NOTE — Discharge Instructions (Signed)
Stop taking ciprofloxacin and metronidazole. You are allergic to one of them (probably ciprofloxacin).  Take your Xyzal once a day.  You may take diphenhydramine as needed to help control the itching.  The rash will get worse before it gets better. It may take several weeks to go away.

## 2020-06-17 NOTE — ED Triage Notes (Signed)
C/o rash x 1 day , after starting cipro and flagyl DX diverticulosis

## 2020-06-19 ENCOUNTER — Ambulatory Visit: Payer: 59 | Admitting: Nurse Practitioner

## 2020-06-19 NOTE — Progress Notes (Deleted)
06/19/2020 Paige Lawrence [id number] [birth year]   CHIEF COMPLAINT:   HISTORY OF PRESENT ILLNESS:  Paige Lawrence is a 64 year old female with a past medical history of degenerative disc disease, spinal stenosis, osteopenia, mitral valve prolapse, GERD   She developed left lower quadrant abdominal pain on 06/12/2020.  She was seen by her gynecologist the next day who advised the patient to present to the ED to rule out diverticulitis.  She presented to [facility] ED for further evaluation.  Labs showed a WBC 13.6.  Hemoglobin 15.5.  Urine hemoglobin and leukocytes were positive.   She presented to [place] emergency room on 06/16/2020 with a rash to her back, abdomen, chest and legs which started 2 days after starting Cipro and Metronidazole for diverticulitis.  Cipro and metronidazole were discontinued and she was prescribed Augmentin 875 mg 1 p.o. twice daily x ? Days.  She presents to our office today for further GI evaluation post diverticulitis treatment.   Abdominal/pelvic CT with contrast 06/14/2020: 1. Highly inflamed mid sigmoid colon with underlying diverticula. Favor acute diverticulitis, with no perforation or abscess.  Follow-up is recommended to exclude neoplasm which can have a similar appearance and presentation.  2. Severe distended urinary bladder (744 mL) with mild wall thickening and possible urinary debris. Suspect urinary retention secondary to #1, but superimposed UTI is possible.  3. Cholelithiasis. Aortic Atherosclerosis       Past Medical History:  Diagnosis Date   DDD (degenerative disc disease), lumbar    GERD (gastroesophageal reflux disease)    Hemorrhoids    Herniated disc, cervical    c6-7   Hx of spinal stenosis    Hypoglycemic reaction    Mitral valve prolapse    Osteopenia    -1.7 2/05; -1.4 3/07; -2.2 7/11   Vitamin D deficiency    26 4/10; 28 5/11   Past Surgical History:  Procedure Laterality  Date   CESAREAN SECTION     COLONOSCOPY     WISDOM TOOTH EXTRACTION      Social History:  Family History:    reports that she has been smoking cigarettes. She has been smoking about 0.50 packs per day. She has never used smokeless tobacco. She reports current alcohol use. She reports that she does not use drugs. family history includes Breast cancer in her cousin; Cancer in her maternal aunt and son; Colon cancer in her maternal aunt; Colon polyps in her father; Diabetes in her paternal aunt; Heart disease in her brother; Hypertension in her mother. Allergies  Allergen Reactions   Aspirin Other (See Comments)    Burning in stomach   Ibuprofen Other (See Comments)    Burning pressure on head   Sulfa Antibiotics Other (See Comments)    Leg weakness, burning pressure on head   Ciprofloxacin Hcl Rash      Outpatient Encounter Medications as of 06/19/2020  Medication Sig   ALPRAZolam (XANAX) 0.5 MG tablet Take 1 tablet (0.5 mg total) by mouth 2 (two) times daily.   amoxicillin-clavulanate (AUGMENTIN) 875-125 MG tablet Take 1 tablet by mouth 2 (two) times daily.   calcium carbonate (TUMS - DOSED IN MG ELEMENTAL CALCIUM) 500 MG chewable tablet Chew 1 tablet by mouth daily.   esomeprazole (NEXIUM) 40 MG capsule Take 40 mg by mouth daily before breakfast.     HYDROcodone-acetaminophen (NORCO/VICODIN) 5-325 MG tablet Take 1 tablet by mouth every 4 (four) hours as needed.   ondansetron (ZOFRAN ODT) 4  MG disintegrating tablet Take 1 tablet (4 mg total) by mouth every 8 (eight) hours as needed for nausea or vomiting.   No facility-administered encounter medications on file as of 06/19/2020.     REVIEW OF SYSTEMS: All other systems reviewed and negative except where noted in the History of Present Illness.  Gen: Denies fever, sweats or chills. No weight loss.  CV: Denies chest pain, palpitations or edema. Resp: Denies cough, shortness of breath of hemoptysis.  GI: Denies  heartburn, dysphagia, stomach or lower abdominal pain. No diarrhea or constipation.  GU : Denies urinary burning, blood in urine, increased urinary frequency or incontinence. MS: Denies joint pain, muscles aches or weakness. Derm: Denies rash, itchiness, skin lesions or unhealing ulcers. Psych: Denies depression, anxiety, memory loss, suicidal ideation and confusion. Heme: Denies bruising, bleeding. Neuro:  Denies headaches, dizziness or paresthesias. Endo:  Denies any problems with DM, thyroid or adrenal function.    PHYSICAL EXAM: LMP 11/04/1996  General: Well developed ... in no acute distress. Head: Normocephalic and atraumatic. Eyes:  Sclerae non-icteric, conjunctive pink. Ears: Normal auditory acuity. Mouth: Dentition intact. No ulcers or lesions.  Neck: Supple, no lymphadenopathy or thyromegaly.  Lungs: Clear bilaterally to auscultation without wheezes, crackles or rhonchi. Heart: Regular rate and rhythm. No murmur, rub or gallop appreciated.  Abdomen: Soft, nontender, non distended. No masses. No hepatosplenomegaly. Normoactive bowel sounds x 4 quadrants.  Rectal:  Musculoskeletal: Symmetrical with no gross deformities. Skin: Warm and dry. No rash or lesions on visible extremities. Extremities: No edema. Neurological: Alert oriented x 4, no focal deficits.  Psychological:  Alert and cooperative. Normal mood and affect.  ASSESSMENT AND PLAN:    CC:  No ref. provider found

## 2020-07-03 ENCOUNTER — Other Ambulatory Visit: Payer: Self-pay

## 2020-07-03 ENCOUNTER — Telehealth: Payer: Self-pay | Admitting: Internal Medicine

## 2020-07-03 ENCOUNTER — Telehealth: Payer: Self-pay | Admitting: General Practice

## 2020-07-03 DIAGNOSIS — R1032 Left lower quadrant pain: Secondary | ICD-10-CM

## 2020-07-03 MED ORDER — AMOXICILLIN-POT CLAVULANATE 875-125 MG PO TABS
1.0000 | ORAL_TABLET | Freq: Two times a day (BID) | ORAL | 0 refills | Status: DC
Start: 2020-07-03 — End: 2020-07-03

## 2020-07-03 MED ORDER — AMOXICILLIN-POT CLAVULANATE 875-125 MG PO TABS: 1.0000 | ORAL_TABLET | Freq: Two times a day (BID) | ORAL | 0 refills | Status: DC

## 2020-07-03 NOTE — Telephone Encounter (Signed)
Patient called states she is having a diverticulitis flare up and needs advise

## 2020-07-03 NOTE — Telephone Encounter (Signed)
Spoke with pt and she is aware of Dr. Quentin Mulling recommendations. Script sent to pharmacy. Pt scheduled for CT of A/P at [facility] 07/26/20@10 :30am. Pt to arrive there at 10:15am. Pt to be NPO after 6:30am, drink bottle 1 of contrast at 8:30am, bottle 2 at 9:30am. Pt to pick up contrast from radiology at [facility] prior to scan. Pt knows to keep OV as scheduled.

## 2020-07-03 NOTE — Telephone Encounter (Signed)
Scheduled patient for CPE to re-establish care, advised to call GI to get earlier appointment per ED notes.

## 2020-07-03 NOTE — Telephone Encounter (Signed)
Pt states she was seen in the ER on 8/11 with abd pain and diagnosed with diverticulitis, treated with cipro and flagyl. 2-3 days later she had an allergic reaction to one of the meds. She was seen in the ER again on 8/14 and placed on 10 days of augmentin. She finished her last dose on 06/28/20. States she started to have a little discomfort on Saturday but on Sunday she had LLQ abd pain all day and through the night. Pt has OV scheduled on 07/31/20 for follow-up. States she thinks she needs another round of antibiotics. Please advise.

## 2020-07-03 NOTE — Telephone Encounter (Signed)
Paige Lawrence [phone]  [name] called to say she has abdominal pain again, she finished antibiotic a few days ago, and within 2 days of completing it the pain has come back. She could not get an appointment with GI until late September.

## 2020-07-03 NOTE — Telephone Encounter (Signed)
Ok to repeat Augmentin 875 mg BID x 10 days Would use Florastor 250 mg BID as well  Need to repeat CT scan in 3-4 weeks given abnormalities seen on CT scan 2+ weeks ago Needs to keep OV as scheduled Needs colonoscopy as well, which we can discuss at OV To ER if worsening in the meantime

## 2020-07-03 NOTE — Telephone Encounter (Signed)
We have not seen her in 4 years. She would be a new patient. She was in ED  recently with reported diverticulitis and referred to GI. She has an appt with [facility] next month. A CT was in ED suggested inflammation. I would suggest she call [facility] to see if she can be worked in this week. If she wants to re-establish, then she needs to book CPE and come annually.

## 2020-07-25 ENCOUNTER — Other Ambulatory Visit: Payer: Self-pay

## 2020-07-25 MED ORDER — PREDNISONE 50 MG PO TABS
ORAL_TABLET | ORAL | 0 refills | Status: DC
Start: 2020-07-25 — End: 2020-08-15

## 2020-07-25 MED ORDER — DIPHENHYDRAMINE HCL 50 MG PO TABS
ORAL_TABLET | ORAL | 0 refills | Status: DC
Start: 2020-07-25 — End: 2020-08-24

## 2020-07-26 ENCOUNTER — Other Ambulatory Visit: Payer: Self-pay

## 2020-07-26 ENCOUNTER — Ambulatory Visit (HOSPITAL_COMMUNITY)
Admission: RE | Admit: 2020-07-26 | Discharge: 2020-07-26 | Disposition: A | Discharge status: Home / Self Care | Payer: 59 | Source: Ambulatory Visit | Attending: Internal Medicine | Admitting: Internal Medicine

## 2020-07-26 DIAGNOSIS — R1032 Left lower quadrant pain: Secondary | ICD-10-CM | POA: Diagnosis present

## 2020-07-26 LAB — POCT I-STAT CREATININE: Creatinine, Ser: 0.7 mg/dL (ref 0.44–1.00)

## 2020-07-26 MED ORDER — IOHEXOL 300 MG/ML  SOLN
100.0000 mL | Freq: Once | INTRAMUSCULAR | Status: AC | PRN
  Administered 2020-07-26: 100 mL via INTRAVENOUS

## 2020-07-27 ENCOUNTER — Telehealth: Payer: Self-pay | Admitting: Nurse Practitioner

## 2020-07-27 NOTE — Telephone Encounter (Signed)
Pt states she had ct done yesterday and did take the 13 hour prep but this am she is broken out in an itchy rash all over. She thinks this is related to the CT contrast, she broke out last time and the provider felt it was a reaction to her antibiotics and the meds were changed. Now pt feels it was all contrast related. Today pt is having lower abd pain that is going down her pelvic area and bottom. She has not had a BM since scan. States she has had chills but now when she tries to sit up she gets flushed and feels like she is going to pass out. Discussed with pt that she should be evaluated in the ER. Pt knows and will try to get to the ER. Her friend is currently with her at home and can take her.

## 2020-07-31 ENCOUNTER — Encounter: Payer: Self-pay | Admitting: Nurse Practitioner

## 2020-07-31 ENCOUNTER — Ambulatory Visit (INDEPENDENT_AMBULATORY_CARE_PROVIDER_SITE_OTHER): Payer: 59 | Admitting: Nurse Practitioner

## 2020-07-31 VITALS — BP 120/82 | HR 110 | Ht 63.0 in | Wt 120.4 lb

## 2020-07-31 DIAGNOSIS — Z1211 Encounter for screening for malignant neoplasm of colon: Secondary | ICD-10-CM | POA: Diagnosis not present

## 2020-07-31 DIAGNOSIS — K5900 Constipation, unspecified: Secondary | ICD-10-CM

## 2020-07-31 DIAGNOSIS — K219 Gastro-esophageal reflux disease without esophagitis: Secondary | ICD-10-CM

## 2020-07-31 DIAGNOSIS — R112 Nausea with vomiting, unspecified: Secondary | ICD-10-CM

## 2020-07-31 MED ORDER — PANTOPRAZOLE SODIUM 40 MG PO TBEC
40.0000 mg | DELAYED_RELEASE_TABLET | Freq: Every day | ORAL | 3 refills | Status: DC
Start: 2020-07-31 — End: 2020-12-21

## 2020-07-31 MED ORDER — ONDANSETRON 4 MG PO TBDP: 4.0000 mg | ORAL_TABLET | Freq: Three times a day (TID) | ORAL | 2 refills | Status: DC | PRN

## 2020-07-31 NOTE — Patient Instructions (Signed)
If you are age 64 or older, your body mass index should be between 23-30. Your Body mass index is 21.32 kg/m. If this is out of the aforementioned range listed, please consider follow up with your Primary Care Provider.  If you are age 11 or younger, your body mass index should be between 19-25. Your Body mass index is 21.32 kg/m. If this is out of the aformentioned range listed, please consider follow up with your Primary Care Provider.   We have sent the following medications to your pharmacy for you to pick up at your convenience: Zofran 4mg  every 8 hours as needed  Continue Miralax twice daily Use ducolax suppositories daily  Increase your protonix to 20 mg twice daily. We are wanting you to take 40mg  daily   Due to recent changes in healthcare laws, you may see the results of your imaging and laboratory studies on MyChart before your provider has had a chance to review them.  We understand that in some cases there may be results that are confusing or concerning to you. Not all laboratory results come back in the same time frame and the provider may be waiting for multiple results in order to interpret others.  Please give [place] 48 hours in order for your provider to thoroughly review all the results before contacting the office for clarification of your results.

## 2020-07-31 NOTE — Telephone Encounter (Signed)
She was seen in the ER and will see Paige Lawrence today

## 2020-07-31 NOTE — Progress Notes (Signed)
ASSESSMENT AND PLAN    # Diverticulitis documented on CT scan 06/14/20.  --Resolved on follow up CT scan 9/22. No abdominal pain but she is generally not feeling well ( weakness, rash from antibiotics vrs CT scan contrast,  nausea, UTI ).  --Needs colonoscopy when able to rule out neoplasm /evaluate CT scan findings. She will call us with an update in a next week or so. If feeling better then will schedule colonoscopy.   # Constipation --Continue Miralax twice daily until bowels are moving well --Dulcolax suppositories daily   # Probable dehydration --WBC 15.3, HCT 48.8. Normal renal function ( [facility] ED). Got IVF yesterday.  --Continue Zofran and push fluids - 4 to 6 bottles of water or pedialyte daily.   # Nausea --Take Zofran 4mg  Q 8 hours as needed. . Will refill --If nausea and / or GERD symptoms don't improve then consider EGD to be done at time of colonoscopy   # GERD --Heartburn which just started a few days ago.  --Continue Protonix 40 mg before breakfast.    HISTORY OF PRESENT ILLNESS     Primary Gastroenterologist :Zenovia Jarred, MD  Chief Complaint : nausea, weakness  Paige Lawrence is a 64 y.o. female with PMH / [place] significant for,  but not necessarily limited to: cholelithiasis, diverticulitis.   Seen in ED on 8/11 with abdominal pain. CT scan remarkable for sigmoid diverticulitis, cholelithiasis and a distended urinary bladder. She went home with a foley cath which was removed a couple of weeks ago ( saw Urology). ED gave her cipro and flagyl. The following day she developed a rash.and went to [facility] ED who changed her to Amoxicillin. She completed the course of Augmentin but within 3 days developed recurrent LLQ pain. Called our office, we gave her 10 additional days of Amoxicillin. A follow up CT scan on 07/26/20 showed resolution of diverticulitis.   Patient required prednisone prior to the second CT scan because  with the first CT scan she experienced  some "thickness" in her throat. She wasn't SOB. On 9/23, the day after 2nd CT scan patient woke up itching, with a generalized rash and also generalized abdominal pain, especially in RUQ. Marland Kitchen She thinks the rash was from contrast for CT scan ( including the first time when she thought it was from antibiotics). For persistent symptoms she went to [facility] ED on Friday. She was started on Prilosec, benadryl and prednisone ( rash) and Miralax. On Saturday she started vomiting dark emesis. Son took her back to [facility] ED. WBC was 15.3, hgb 17.1, hct 48. Normal renal function. Tbili 1.29, remainder of LFTs normal. RUQ remarkable for cholelithiasis, no gallbladder wall thickening, CBD normal. Prescribed Zofran.  Also diagnosed with UTI and now on keflex.   Vomited again today, emesis was grey in color. The RUQ pain has improved. Since starting the miralax she has had 3 small loose BMs.   Data Reviewed: 06/14/20 CT scan  abd/ pelvis with contrast MPRESSION: 1. Highly inflamed mid sigmoid colon with underlying diverticula. Favor acute diverticulitis, with no perforation or abscess. Follow-up is recommended to exclude neoplasm which can have a similar appearance and presentation.  2. Severe distended urinary bladder (744 mL) with mild wall thickening and possible urinary debris. Suspect urinary retention secondary to #1, but superimposed UTI is possible.  3. Cholelithiasis. Aortic Atherosclerosis (ICD10-I70.0).   9/22 CT scan  abd/ pelvis with contrast IMPRESSION: 1. The previous appearance of inflammation along the sigmoid colon  has resolved. I do not perceive a distinct mass or abnormality separate from the diverticulosis in this vicinity currently. 2. Thick-walled urinary bladder with distention of the urinary bladder, the bladder currently contains approximately 590 cubic cm of urine, previously 744 cubic cm. This could be from chronic outlet obstruction or chronic cystitis. 3. Small type 1  hiatal hernia. 4. Disc bulge and central disc protrusion at L5-S1. 5. Suspected small gallstone within the gallbladder. 6. Aortic atherosclerosis   Previous Endoscopic Evaluations / Pertinent Studies:   Past Medical History:  Diagnosis Date  . DDD (degenerative disc disease), lumbar   . GERD (gastroesophageal reflux disease)   . Hemorrhoids   . Herniated disc, cervical    c6-7  . Hx of spinal stenosis   . Hypoglycemic reaction   . Mitral valve prolapse   . Osteopenia    -1.7 2/05; -1.4 3/07; -2.2 7/11  . Vitamin D deficiency    26 4/10; 28 5/11     Past Surgical History:  Procedure Laterality Date  . CESAREAN SECTION    . COLONOSCOPY    . WISDOM TOOTH EXTRACTION     Family History  Problem Relation Age of Onset  . Cancer Maternal Aunt        colon  . Colon cancer Maternal Aunt   . Hypertension Mother   . Colon polyps Father   . Heart disease Brother   . Diabetes Paternal Aunt   . Breast cancer Cousin        maternal cousin  . Cancer Son        Appendix   Social History   Tobacco Use  . Smoking status: Current Every Day Smoker    Packs/day: 0.50    Types: Cigarettes  . Smokeless tobacco: Never Used  Vaping Use  . Vaping Use: Never used  Substance Use Topics  . Alcohol use: Yes    Comment:  rarely  . Drug use: No   Current Outpatient Medications  Medication Sig Dispense Refill  . ALPRAZolam (XANAX) 0.5 MG tablet Take 1 tablet (0.5 mg total) by mouth 2 (two) times daily. 60 tablet 5  . calcium carbonate (TUMS - DOSED IN MG ELEMENTAL CALCIUM) 500 MG chewable tablet Chew 1 tablet by mouth daily.    . cephALEXin (KEFLEX) 500 MG capsule Take by mouth.    . diphenhydrAMINE (BENADRYL) 50 MG tablet Take 1 tab 1 hour prior to scan. 1 tablet 0  . HYDROcodone-acetaminophen (NORCO/VICODIN) 5-325 MG tablet Take 1 tablet by mouth every 4 (four) hours as needed. 10 tablet 0  . ondansetron (ZOFRAN ODT) 4 MG disintegrating tablet Take 1 tablet (4 mg total) by mouth  every 8 (eight) hours as needed for nausea or vomiting. 20 tablet 0  . pantoprazole (PROTONIX) 20 MG tablet     . polyethylene glycol (MIRALAX) 17 g packet Take 17 g by mouth daily.    . predniSONE (DELTASONE) 50 MG tablet Take 1 tab 13 hours prior to CT, take 1 tab 7 hours prior to scan, take 1 tab 1 hour prior to scan 3 tablet 0   No current facility-administered medications for this visit.   Allergies  Allergen Reactions  . Aspirin Other (See Comments)    Burning in stomach  . Ibuprofen Other (See Comments)    Burning pressure on head  . Sulfa Antibiotics Other (See Comments)    Leg weakness, burning pressure on head  . Ciprofloxacin Hcl Rash     Review of Systems: All  systems reviewed and negative except where noted in HPI.   PHYSICAL EXAM :    Wt Readings from Last 3 Encounters:  07/31/20 120 lb 6 oz (54.6 kg)  06/17/20 122 lb 14.4 oz (55.7 kg)  06/14/20 125 lb (56.7 kg)    BP 120/82   Pulse (!) 110   Ht 5\' 3"  (1.6 m)   Wt 120 lb 6 oz (54.6 kg)   LMP 11/04/1996   BMI 21.32 kg/m  Constitutional:  Pleasant female in no acute distress. Psychiatric: Normal mood and affect. Behavior is normal. EENT: Pupils normal.  Conjunctivae are normal. No scleral icterus. Neck supple.  Cardiovascular: Normal rate, regular rhythm. No edema Pulmonary/chest: Effort normal and breath sounds normal. No wheezing, rales or rhonchi. Abdominal: Soft, mild generalized tenderness, mild distention.  Bowel sounds active throughout. There are no masses palpable. No hepatomegaly. Neurological: Alert and oriented to person place and time. Skin: Skin is warm and dry. No rashes noted.  Tye Savoy, NP  07/31/2020, 3:57 PM  I spent 45 minutes total reviewing records, obtaining history, performing exam, counseling patient and documenting visit / findings.

## 2020-08-04 NOTE — Progress Notes (Signed)
Addendum: Reviewed and agree with assessment and management plan. Anvika Gashi M, MD  

## 2020-08-09 ENCOUNTER — Telehealth: Payer: Self-pay | Admitting: Internal Medicine

## 2020-08-09 NOTE — Telephone Encounter (Signed)
Patient was in [facility] for SBO 08/04/2020 and discharged 08/06/2020 we need to call for Hospital medication reconciliation and schedule hospital follow up. We just got notification today. CPE is scheduled for 08/24/2020 this must be scheduled separate.

## 2020-08-15 ENCOUNTER — Telehealth: Payer: Self-pay | Admitting: Internal Medicine

## 2020-08-15 ENCOUNTER — Other Ambulatory Visit: Payer: Self-pay

## 2020-08-15 ENCOUNTER — Encounter: Payer: Self-pay | Admitting: Internal Medicine

## 2020-08-15 ENCOUNTER — Ambulatory Visit: Payer: 59 | Admitting: Internal Medicine

## 2020-08-15 VITALS — BP 100/70 | HR 98 | Ht 63.0 in | Wt 110.0 lb

## 2020-08-15 DIAGNOSIS — R0781 Pleurodynia: Secondary | ICD-10-CM

## 2020-08-15 DIAGNOSIS — Z09 Encounter for follow-up examination after completed treatment for conditions other than malignant neoplasm: Secondary | ICD-10-CM | POA: Diagnosis not present

## 2020-08-15 DIAGNOSIS — K56609 Unspecified intestinal obstruction, unspecified as to partial versus complete obstruction: Secondary | ICD-10-CM

## 2020-08-15 DIAGNOSIS — Z87891 Personal history of nicotine dependence: Secondary | ICD-10-CM

## 2020-08-15 DIAGNOSIS — Z91041 Radiographic dye allergy status: Secondary | ICD-10-CM

## 2020-08-15 DIAGNOSIS — R339 Retention of urine, unspecified: Secondary | ICD-10-CM | POA: Diagnosis not present

## 2020-08-15 DIAGNOSIS — N390 Urinary tract infection, site not specified: Secondary | ICD-10-CM

## 2020-08-15 DIAGNOSIS — Z978 Presence of other specified devices: Secondary | ICD-10-CM

## 2020-08-15 NOTE — Telephone Encounter (Signed)
Received Medical Records.

## 2020-08-15 NOTE — Telephone Encounter (Signed)
Faxed Medical Record request to [facility]

## 2020-08-15 NOTE — Progress Notes (Signed)
Subjective:    Patient ID: Paige Lawrence, female    DOB: May 18, 1956, 64 y.o.   MRN: [id number]  HPI 65 year old Female here for hospital follow up. Very complicated history over past few weeks. Has urinary catheter and is to have urodynamics testing in about 2 weeks at [facility].  Patient has not been seen here since August 2016.  She has a history of anxiety disorder.  Past medical history: History of fractured clavicle in the remote past.  C-section 1980.  Intolerant of sulfa and ibuprofen.  History of hyperplastic colon polyps.  Had colonoscopy at low by our GI in 2013 showing 4 hyperplastic polyps.  Her GYN physician was Dr. Phineas Real who recently retired and she is now seeing Dr. Delilah Shan.  She saw him in January 2021.  On August 11 she presented to [facility] emergency department with abdominal pain.  The previous day she was seen by nurse practitioner at her GYN office.  Pain was in the left lower quadrant and had onset the previous day.  Pain was worse with urination.  Urine dipstick was unremarkable.  Nurse practitioner was suspicious of diverticulitis and recommended emergency department evaluation.  Patient was complaining with worsening pain with urination in the emergency department.  Pain was in left lower quadrant.  White count was elevated at 13,600.  Hemoglobin was normal.  She had small leukocytes on urine dipstick.  CT of the abdomen and pelvis showed highly inflamed mid sigmoid colon with underlying diverticula.  Was felt to have acute diverticulitis without perforation or abscess.  Had severely distended urinary bladder with mild wall thickening.  Was suspected of having urinary retention secondary to diverticulitis.  Cholelithiasis and aortic atherosclerosis were noted.  Patient was treated with Zofran, Norco 5/325 for pain, Cipro and Flagyl.  She was discharged home.  She called GYN office on August 13 saying she felt metronidazole was not agreeing with her.   Noticed itching on her chest and back.  GYN office felt that she might be having reaction to Cipro.  They recommended discontinuing Cipro and continuing metronidazole.  Advised Benadryl.  An appointment was scheduled for patient to be seen at gastroenterology office for the following Monday.  [facility] has been contacted by the patient and she was to have follow-up the following week.  Patient went back to the emergency department on August 14 at [facility].  Rash started on her back but spread to abdomen chest and legs.  She took Xyzal but continued to have rash and itching.  There is a picture in her chart of rash on her back which is significant extending up to her posterior neck and between her shoulder blades down to her buttock area.  It was felt to be an allergic reaction to Cipro.  Patient was advised to continue Xyzal and was told she could use Benadryl as well.  Was placed on Augmentin 875/125 twice daily for 10 days.  She finished this antibiotic and developed recurrent abdominal pain within 2 days of finishing the antibiotics.  She contacted [facility] and was able to get a refill on the Augmentin on August 30th.  Subsequently went to [facility] Sept 26 with complaint of cystitis symptoms and upper abdominal pain. Had WBC of 15,300.  Urine specimen was positive for Leukocyte Estrace but negative for nitrite.  Lipase was normal.  Troponin was normal.  Ultrasound of gallbladder showed a 6 mm gallstone.  Right kidney ultrasound showed  no hydronephrosis.  She was treated with Keflex for 7 days twice daily.  Was discharged September 26 from emergency department.    She presented back to [facility] on October 1.  She was admitted after CT was obtained for abdominal distension.  CT showed dilated small bowel loops with air-fluid levels.  No evidence of diverticulitis.  Had bladder wall thickening and gallstones.  Had significant urinary retention.  A catheter was placed  in it is still in place at the present time.  She has follow-up with [facility].  It was felt that her allergic reaction was due to contrast media so contrast was not given on CT scan.  She was managed conservatively and did not have to have surgery.  She was discharged home on Keflex.  During the hospitalization she was confirmed to have a UTI.  Catheter was left in place because of post void residual close to 800 cc.  Was treated with IV Rocephin in the hospital.  Had NG tube during hospitalization.  Was unwilling to do in and out cath as an outpatient.  Therefore was sent home on Foley.  Is to have urodynamic studies next week by urology.  Urticarial rash was thought to be due to a reaction to IV contrast given for CT.  Was told she can eat a regular diet.  Was told to follow-up with primary care doctor.  Was placed on MiraLAX and PPI.  Was given Zofran for nausea.  Was given Norco sparingly for pain.  Has follow-up with [facility] November 9th. Apparently is to have urodynamic studies and urology office.  Review of Systems no nausea or vomiting.  Is eating soft diet at the present time.     Objective:   Physical Exam Blood pressure 100/70 pulse 98 pulse oximetry 97% weight 110 pounds height 5 feet 3 inches BMI 19.49  Skin warm and dry.  Apparent allergic reaction to IV contrast media has resolved on back.  Her chest is clear to auscultation.  Cardiac exam regular rate and rhythm.  Abdomen is soft without hepatosplenomegaly.  No significant tenderness.  A catheter is in place so we cannot collect a urine specimen today.  She has tenderness over her lower right rib cage area and will have chest x-ray and right rib detail film.  She has a history of smoking.  A CBC obtained today shows White blood cell count of 8700, hemoglobin 14.9 g and platelet count 418,000.  Glucose is 82.  Electrolytes are normal.  BUN is 4 and creatinine is 0.69.  Urine specimen not examined as she has  catheter in place.  Has follow-up with urology.       Assessment & Plan:  Beginning to feel better status post admission for small bowel obstruction after a bout of diverticulitis and allergic reaction to IV contrast media.  Has urinary retention and is seeing urologist.  Catheter is in place.  We have scheduled her for health maintenance exam in the near future as she has not been here in 4 years.  She will follow-up with lower GI in November and will continue follow-up with [facility]

## 2020-08-16 LAB — CBC WITH DIFFERENTIAL/PLATELET
Absolute Monocytes: 722 cells/uL (ref 200–950)
Basophils Absolute: 78 cells/uL (ref 0–200)
Basophils Relative: 0.9 %
Eosinophils Absolute: 61 cells/uL (ref 15–500)
Eosinophils Relative: 0.7 %
HCT: 44.5 % (ref 35.0–45.0)
Hemoglobin: 14.9 g/dL (ref 11.7–15.5)
Lymphs Abs: 1888 cells/uL (ref 850–3900)
MCH: 32.7 pg (ref 27.0–33.0)
MCHC: 33.5 g/dL (ref 32.0–36.0)
MCV: 97.6 fL (ref 80.0–100.0)
MPV: 10.7 fL (ref 7.5–12.5)
Monocytes Relative: 8.3 %
Neutro Abs: 5951 cells/uL (ref 1500–7800)
Neutrophils Relative %: 68.4 %
Platelets: 418 10*3/uL — ABNORMAL HIGH (ref 140–400)
RBC: 4.56 10*6/uL (ref 3.80–5.10)
RDW: 12.3 % (ref 11.0–15.0)
Total Lymphocyte: 21.7 %
WBC: 8.7 10*3/uL (ref 3.8–10.8)

## 2020-08-16 LAB — BASIC METABOLIC PANEL
BUN/Creatinine Ratio: 6 (calc) (ref 6–22)
BUN: 4 mg/dL — ABNORMAL LOW (ref 7–25)
CO2: 25 mmol/L (ref 20–32)
Calcium: 9.2 mg/dL (ref 8.6–10.4)
Chloride: 102 mmol/L (ref 98–110)
Creat: 0.69 mg/dL (ref 0.50–0.99)
Glucose, Bld: 82 mg/dL (ref 65–99)
Potassium: 4.1 mmol/L (ref 3.5–5.3)
Sodium: 141 mmol/L (ref 135–146)

## 2020-08-17 ENCOUNTER — Ambulatory Visit
Admission: RE | Admit: 2020-08-17 | Discharge: 2020-08-17 | Disposition: A | Discharge status: Home / Self Care | Payer: 59 | Source: Ambulatory Visit | Attending: Internal Medicine | Admitting: Internal Medicine

## 2020-08-21 ENCOUNTER — Other Ambulatory Visit: Payer: Self-pay

## 2020-08-21 ENCOUNTER — Other Ambulatory Visit: Payer: 59 | Admitting: Internal Medicine

## 2020-08-21 DIAGNOSIS — Z1321 Encounter for screening for nutritional disorder: Secondary | ICD-10-CM

## 2020-08-21 DIAGNOSIS — Z87891 Personal history of nicotine dependence: Secondary | ICD-10-CM

## 2020-08-21 DIAGNOSIS — J440 Chronic obstructive pulmonary disease with acute lower respiratory infection: Secondary | ICD-10-CM

## 2020-08-21 DIAGNOSIS — J209 Acute bronchitis, unspecified: Secondary | ICD-10-CM

## 2020-08-21 DIAGNOSIS — Z13220 Encounter for screening for lipoid disorders: Secondary | ICD-10-CM

## 2020-08-21 DIAGNOSIS — Z0000 Encounter for general adult medical examination without abnormal findings: Secondary | ICD-10-CM

## 2020-08-21 DIAGNOSIS — Z1329 Encounter for screening for other suspected endocrine disorder: Secondary | ICD-10-CM

## 2020-08-22 LAB — COMPLETE METABOLIC PANEL WITH GFR
AG Ratio: 1.3 (calc) (ref 1.0–2.5)
ALT: 6 U/L (ref 6–29)
AST: 13 U/L (ref 10–35)
Albumin: 3.9 g/dL (ref 3.6–5.1)
Alkaline phosphatase (APISO): 85 U/L (ref 37–153)
BUN/Creatinine Ratio: 7 (calc) (ref 6–22)
BUN: 5 mg/dL — ABNORMAL LOW (ref 7–25)
CO2: 27 mmol/L (ref 20–32)
Calcium: 9.9 mg/dL (ref 8.6–10.4)
Chloride: 102 mmol/L (ref 98–110)
Creat: 0.74 mg/dL (ref 0.50–0.99)
GFR, Est African American: 100 mL/min/{1.73_m2} (ref >=60)
GFR, Est Non African American: 86 mL/min/{1.73_m2} (ref >=60)
Globulin: 3 g/dL (calc) (ref 1.9–3.7)
Glucose, Bld: 87 mg/dL (ref 65–99)
Potassium: 4.7 mmol/L (ref 3.5–5.3)
Sodium: 140 mmol/L (ref 135–146)
Total Bilirubin: 0.5 mg/dL (ref 0.2–1.2)
Total Protein: 6.9 g/dL (ref 6.1–8.1)

## 2020-08-22 LAB — CBC WITH DIFFERENTIAL/PLATELET
Absolute Monocytes: 600 cells/uL (ref 200–950)
Basophils Absolute: 83 cells/uL (ref 0–200)
Basophils Relative: 1.2 %
Eosinophils Absolute: 117 cells/uL (ref 15–500)
Eosinophils Relative: 1.7 %
HCT: 44.6 % (ref 35.0–45.0)
Hemoglobin: 15.1 g/dL (ref 11.7–15.5)
Lymphs Abs: 1925 cells/uL (ref 850–3900)
MCH: 32.8 pg (ref 27.0–33.0)
MCHC: 33.9 g/dL (ref 32.0–36.0)
MCV: 97 fL (ref 80.0–100.0)
MPV: 10.5 fL (ref 7.5–12.5)
Monocytes Relative: 8.7 %
Neutro Abs: 4175 cells/uL (ref 1500–7800)
Neutrophils Relative %: 60.5 %
Platelets: 386 10*3/uL (ref 140–400)
RBC: 4.6 10*6/uL (ref 3.80–5.10)
RDW: 12.4 % (ref 11.0–15.0)
Total Lymphocyte: 27.9 %
WBC: 6.9 10*3/uL (ref 3.8–10.8)

## 2020-08-22 LAB — LIPID PANEL
Cholesterol: 141 mg/dL (ref <200)
HDL: 38 mg/dL — ABNORMAL LOW (ref >=50)
LDL Cholesterol (Calc): 84 mg/dL (calc)
Non-HDL Cholesterol (Calc): 103 mg/dL (calc) (ref <130)
Total CHOL/HDL Ratio: 3.7 (calc) (ref <5.0)
Triglycerides: 92 mg/dL (ref <150)

## 2020-08-22 LAB — VITAMIN D 25 HYDROXY (VIT D DEFICIENCY, FRACTURES): Vit D, 25-Hydroxy: 11 ng/mL — ABNORMAL LOW (ref 30–100)

## 2020-08-22 LAB — TSH: TSH: 2.17 mIU/L (ref 0.40–4.50)

## 2020-08-23 ENCOUNTER — Telehealth: Payer: Self-pay

## 2020-08-23 NOTE — Telephone Encounter (Signed)
Spoke with the patient. She is still recovering from her hospitalization. She presently has an indwelling urinary catheter. She has followed up with her PCP and has pending urinary procedures. She feels her upper GI symptoms are mostly controlled however she is not eating very much. She is still on Pantoprazole. Her plan is to call is a couple of weeks to schedule her procedures. She wants the EGD and the colonoscopy. Please advise.

## 2020-08-23 NOTE — Telephone Encounter (Signed)
Paige Lawrence, I would really like to see her before scheduling these procedures since she has been in the hospital since I last saw her. Additionally she overall felt terrible when I saw her. Thanks

## 2020-08-24 ENCOUNTER — Other Ambulatory Visit: Payer: Self-pay

## 2020-08-24 ENCOUNTER — Encounter: Payer: Self-pay | Admitting: Internal Medicine

## 2020-08-24 ENCOUNTER — Ambulatory Visit (INDEPENDENT_AMBULATORY_CARE_PROVIDER_SITE_OTHER): Payer: 59 | Admitting: Internal Medicine

## 2020-08-24 VITALS — BP 110/70 | HR 90 | Ht 63.0 in | Wt 108.0 lb

## 2020-08-24 DIAGNOSIS — E559 Vitamin D deficiency, unspecified: Secondary | ICD-10-CM

## 2020-08-24 DIAGNOSIS — Z91041 Radiographic dye allergy status: Secondary | ICD-10-CM | POA: Diagnosis not present

## 2020-08-24 DIAGNOSIS — F411 Generalized anxiety disorder: Secondary | ICD-10-CM

## 2020-08-24 DIAGNOSIS — R339 Retention of urine, unspecified: Secondary | ICD-10-CM

## 2020-08-24 DIAGNOSIS — Z978 Presence of other specified devices: Secondary | ICD-10-CM

## 2020-08-24 DIAGNOSIS — N390 Urinary tract infection, site not specified: Secondary | ICD-10-CM

## 2020-08-24 DIAGNOSIS — Z87891 Personal history of nicotine dependence: Secondary | ICD-10-CM

## 2020-08-24 DIAGNOSIS — Z0000 Encounter for general adult medical examination without abnormal findings: Secondary | ICD-10-CM

## 2020-08-24 DIAGNOSIS — K56609 Unspecified intestinal obstruction, unspecified as to partial versus complete obstruction: Secondary | ICD-10-CM

## 2020-08-24 MED ORDER — ERGOCALCIFEROL 1.25 MG (50000 UT) PO CAPS: 50000.0000 [IU] | ORAL_CAPSULE | ORAL | 2 refills | Status: DC

## 2020-08-24 NOTE — Progress Notes (Signed)
 Subjective:    Patient ID: Paige Lawrence, female    DOB: 01/28/1956, 64 y.o.   MRN: [id number]  HPI  64 year old Female for health maintenance exam.  Currently she is recovering from admission to [facility] October 1 through October 3  for small bowel obstruction and urinary retention.  Was also found to have a urinary tract infection.  Patient has had a complex clinical course over the past several weeks.  Initially, patient was diagnosed with diverticulitis  August 11 at [place] emergency department and  was treated as an outpatient.  She developed a rash on August 14 and went to [facility].  The rash was spreading from her back to her abdomen chest and legs and was itchy.  She took Xyzal but rash continued to spread.  She had been started on Cipro and metronidazole for diverticulitis.  Noted that abdominal pain had improved.  Was thought this could possibly be a drug rash most likely Cipro.  Was given a dose of Augmentin and sent home with prescription for Augmentin.  Advised to continue with Xyzal and Benadryl.  Was advised to contact primary care provider.  We did not hear from her until August 30.  We had not seen the patient in 4 years.  During the [facility] hospitalization October 1 through October 3 for small bowel obstruction, she developed urinary retention and has a Foley catheter in place and is being followed by Urologist.  Is scheduled to have urodynamic testing next week at [facility] office.    Records from [facility] indicate was seen  on September 9 for ongoing urinary retention due to diverticulitis.  Urine culture on August 11 showed no growth.  She has been seen  in the Urology office on August 18 and August 25 each time having post void residual that was significant.  On October 7 had voided volume of 300 cc and postvoid residual of 200 cc.  Catheter was changed on October 7 at urology office.  Urologist feels that  urinary retention is related to inflammation from underlying diverticulitis.  She also had diagnostic cystoscopy to rule out bladder cancer.  There were no suspicious lesions identified.  However there was a significant amount of inflammatory reaction due to indwelling Foley catheter.   Recently had hospital follow-up here October 12 and was complaining of right rib pain.  Rib detail film was negative as was Chest x-ray.  Foley catheter remains in place.  She continues to smoke.  Urologist saw her on October 7 and apparently urologist placed her on Myrbetriq.  Augmentin was discontinued because she had completed course prescribed at time of discharge.   Today, she says feeling stronger than at last visit.  Patient says she plans on having colonoscopy in early January  Additional past medical history: History of GE reflux, spinal stenosis, herniated cervical disc at C6-C7, osteopenia, mitral valve prolapse.  History of C-section.  Family history: Colon cancer in maternal aunt.  Colon polyps in father.  Heart disease in brother.  Hypertension in mother.  SHx: one daughter, age 24, in graduate school at [employer].  Patient resides alone.  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  Review of Systems- declines flu vaccine and Covid-19 vaccines. Needs to schedule mammogram- [facility]      Objective:   Physical Exam Vital signs reviewed.  She is afebrile. Skin warm and dry.  No cervical adenopathy.    Chest clear.  Cardiac exam tachycardia regular rate and rhythm.  1/6 to 2/6 systolic ejection murmur.  Abdomen soft nondistended without hepatosplenomegaly masses or tenderness.  Right rib cage pain that she was complaining about at last visit has resolved.  Explained that rib detail and chest x-ray were negative.  GYN exam deferred to GYN physician.  Foley catheter in place.  No lower extremity edema.  She is anxious today but less anxious than she was  at initial visit.      Assessment & Plan:  History of allergic reaction/dermatitis to IV contrast media  Urinary retention thought to be due to inflammation with recent bout of acute diverticulitis.  Patient has been tried on Myrbetriq and is to have a urodynamic study soon at urology office.  Had negative cystoscopy at urology office.  Acute diverticulitis and small bowel obstruction -resolved with overnight stay in hospital  Anxiety state  History of smoking-patient advised to quit smoking  Vitamin D deficiency-level is 11   Plan: She has follow-up with [facility] in November and with her GYN physician in January.  She is being followed closely by [facility].  She has vitamin D deficiency and has been placed on high-dose vitamin D weekly.  Her level was 11.  CBC is within normal limits as is c-Met.  Kidney functions are normal.  TSH is normal.  She was advised to quit smoking.  She remains on Protonix and MiraLAX at this time.  Recommend follow-up in 3 to 6 months here.  Needs to have mammogram.  Needs to quit smoking.

## 2020-08-24 NOTE — Telephone Encounter (Signed)
Patient advised and scheduled for 09/12/20 at 2:30 pm. Accepts this appointment.

## 2020-08-26 NOTE — Patient Instructions (Signed)
She has upcoming follow-up with lipid allergy in November.  She has health maintenance exam scheduled here soon.  She has a Foley catheter in place and is being followed by Urology.  She is complaining of right rib cage pain and will have chest x-ray and rib detail films since she has history of smoking.

## 2020-08-26 NOTE — Patient Instructions (Addendum)
You have a follow-up appointment for vitamin D testing after taking 50,000 units weekly for 12 weeks.  That will be in February 2022.  Continue follow-up with urologist.  You have appointment with [facility] in November.  Please quit smoking.  Please have mammogram.

## 2020-09-08 ENCOUNTER — Telehealth: Payer: Self-pay | Admitting: Nurse Practitioner

## 2020-09-08 NOTE — Telephone Encounter (Signed)
Spoke with the patient. She has an appointment to be seen on 09/12/20 with you. She calls today to report a LLQ pain she has. She describes it as sharp and intense. It comes and goes. She can relieve the pain by changing her position. This has been occurring for several days. She remains afebrile. Bowel movements are soft. No bloody or black stools. She takes daily Miralax. She is on Macrobid for a UTI.

## 2020-09-08 NOTE — Telephone Encounter (Signed)
Pt would like to speak with with about her gi sxs.

## 2020-09-09 NOTE — Telephone Encounter (Signed)
Beth,  I tried to call patient this am. Got "Cindy's" voicemail which was full so I couldn't leave a message. Please check on her Monday am. Does this feel like when she had diverticulitis? If so them may need to start antibiotics. .Does she associate the pain with her UTI. Any fevers? If she is feeling better then can further evaluate at appt on 11/9. Otherwise please let me know. Thanks

## 2020-09-11 NOTE — Telephone Encounter (Signed)
States she is not worse. Pain still comes an goes. Pain free on 09/09/20. Mild pain off and on on 09/10/20. Afebrile. She will come for the appointment tomorrow.

## 2020-09-12 ENCOUNTER — Other Ambulatory Visit (INDEPENDENT_AMBULATORY_CARE_PROVIDER_SITE_OTHER): Payer: 59

## 2020-09-12 ENCOUNTER — Encounter: Payer: Self-pay | Admitting: Nurse Practitioner

## 2020-09-12 ENCOUNTER — Ambulatory Visit (INDEPENDENT_AMBULATORY_CARE_PROVIDER_SITE_OTHER): Payer: 59 | Admitting: Nurse Practitioner

## 2020-09-12 VITALS — BP 110/68 | HR 85 | Ht 63.75 in | Wt 108.0 lb

## 2020-09-12 DIAGNOSIS — M7918 Myalgia, other site: Secondary | ICD-10-CM | POA: Diagnosis not present

## 2020-09-12 DIAGNOSIS — B373 Candidiasis of vulva and vagina: Secondary | ICD-10-CM | POA: Diagnosis not present

## 2020-09-12 DIAGNOSIS — Z8719 Personal history of other diseases of the digestive system: Secondary | ICD-10-CM | POA: Diagnosis not present

## 2020-09-12 DIAGNOSIS — R1032 Left lower quadrant pain: Secondary | ICD-10-CM | POA: Diagnosis not present

## 2020-09-12 DIAGNOSIS — B3731 Acute candidiasis of vulva and vagina: Secondary | ICD-10-CM

## 2020-09-12 MED ORDER — FLUCONAZOLE 150 MG PO TABS: ORAL_TABLET | ORAL | 0 refills | Status: DC

## 2020-09-12 MED ORDER — PLENVU 140 G PO SOLR: 1.0000 | Freq: Once | ORAL | 0 refills | Status: AC

## 2020-09-12 NOTE — Patient Instructions (Addendum)
If you are age 65 or older, your body mass index should be between 23-30. Your Body mass index is 18.68 kg/m. If this is out of the aforementioned range listed, please consider follow up with your Primary Care Provider.  If you are age 64 or younger, your body mass index should be between 19-25. Your Body mass index is 18.68 kg/m. If this is out of the aformentioned range listed, please consider follow up with your Primary Care Provider.   We have sent the following medications to your pharmacy for you to pick up at your convenience: Diflucan 150 mg one tablet today may repeat on day 3.  Salon Pa patches to affected areas as needed.  You have been scheduled for a CT scan of the abdomen and pelvis at [facility] ([address]---this is in the same building as [facility]).   You are scheduled on Monday 09/18/20 at 3 pm. You should arrive 15 minutes prior to your appointment time for registration. Please follow the written instructions below on the day of your exam:  WARNING: IF YOU ARE ALLERGIC TO IODINE/X-RAY DYE, PLEASE NOTIFY RADIOLOGY IMMEDIATELY AT [phone]! YOU WILL BE GIVEN A 13 HOUR PREMEDICATION PREP.  1) Do not eat or drink anything after 11 am (4 hours prior to your test) 2) You have been given 2 bottles of oral contrast to drink. The solution may taste better if refrigerated, but do NOT add ice or any other liquid to this solution. Shake well before drinking.    Drink 1 bottle of contrast @ 1 pm (2 hours prior to your exam)  Drink 1 bottle of contrast @ 2 pm (1 hour prior to your exam)  You may take any medications as prescribed with a small amount of water, if necessary. If you take any of the following medications: METFORMIN, GLUCOPHAGE, GLUCOVANCE, AVANDAMET, RIOMET, FORTAMET, ACTOPLUS MET, JANUMET, GLUMETZA or METAGLIP, you MAY be asked to HOLD this medication 48 hours AFTER the exam.  The purpose of you drinking the oral contrast is to aid in  the visualization of your intestinal tract. The contrast solution may cause some diarrhea. Depending on your individual set of symptoms, you may also receive an intravenous injection of x-ray contrast/dye. Plan on being at [facility] for 30 minutes or longer, depending on the type of exam you are having performed.  This test typically takes 30-45 minutes to complete.  If you have any questions regarding your exam or if you need to reschedule, you may call the CT department at [phone] between the hours of 8:00 am and 5:00 pm, Monday-Friday.  ______________________________________________________________   

## 2020-09-12 NOTE — Progress Notes (Signed)
ASSESSMENT AND PLAN    # 64 yo with uncomplicated diverticulitis in Aug 2021. She has never had a colonoscopy but certainly needs one.  The procedure was delayed due several things including a prolonged diverticulitis recovery time,  reaction to either antibiotics or IV contrast, and admission to [facility] for SBO.  She is here today with recurrent LLQ pain.  It is difficult to sort out the pain given that she is on antibiotics for possible UTI.  She has been struggling with urinary retention of unclear etiology but resulting in need for indwelling foley catheters.  Catheter discontinued yesterday but she is requiring self catheterization.  --I am going to go ahead and schedule her for a colonoscopy to be done in a few weeks.  However, in the interim I will obtain a noncontrast CT scan to make sure she does not have recurrent diverticulitis.  --Obtain U/A  # Upper abdominal discomfort described as "spasms" . Worse when standing / sitting / eating certain foods. Better when lying down.  --trial of Salon pas patches to affected areas.   # Vaginal yeast infection --Diflucfan 150 mg daily. If no improvement in 3 days, repeat x 1  HISTORY OF PRESENT ILLNESS     Primary Gastroenterologist : Zenovia Jarred, MD  Chief Complaint :  Abdominal pain, burning when urinating, yeast infection, upper abdominal spasms.   VANISSA STRENGTH is a 64 y.o. female with PMH / [place] significant for,  but not necessarily limited to: Diverticulitis, cholelithiasis  Patient was seen in the office 07/31/2020 for evaluation of diverticulitis.  She was in the ED mid August with abdominal pain.  CT scan remarkable for sigmoid diverticulitis and a distended urinary bladder.  She was treated with Cipro and Flagyl. Sent home with a Foley catheter.  She developed a rash from antibiotics, went to [facility] ED and was changed to amoxicillin.  Within 3 days of completing antibiotic she developed LLQ pain.  Patient called our office and  we gave her an additional 10 days of amoxicillin.  Follow-up CT scan 07/26/2020 showed resolution of diverticulitis.  Plan was for colonoscopy after resolution of diverticulitis. However I saw patient for an office follow-up 07/31/2020 she was not doing well.  She had developed itching, generalized rash, nausea and vomiting, heartburn and constipation.    She been back and forth to [facility] ED with these symptoms.  I elected to treat her with antiemetics, PPI and gave her a bowel regimen.  Plan was to follow-up and if feeling better then we would get her colonoscopy scheduled.  A few days later however patient ended up being hospitalized at [facility] with a small bowel obstruction.   Since patient's first bout of diverticulitis in August she has struggled with urinary retention requiring indwelling Foley catheter on two occasions.  She is being followed by Urology.  Indwelling foley removed yesterday.  She has had urodynamic studies. She has to self catheterize now   Patient called the office 09/08/2020 with recurrent LLQ pain.  Patient says she does not know if the pain is reminiscent of diverticulitis because didn't have abdominal pain with the diverticulitis. The pain started a week ago. It is a deep, intermittent pain. However,  she is also having burning in LLQ ( different pain) and dysuria. She has just started Macrobid for ? UTI. No fevers. No nausea.      Additionally patient complains of intermittent "spasms" in LUQ and RUQ. Spasms are frequent, nearly "constant" Spasms  can get worse when she eats. Also notices the pain when standing or sitting.  Discomfort not related to bending / twisting. Laying flat or at 45 degree angle helps ease the pain. . BMs are normal on daily Miralax.    Patient complains of a vaginal yeast infection.     Past Medical History:  Diagnosis Date  . DDD (degenerative disc disease), lumbar   . GERD (gastroesophageal reflux disease)   . Hemorrhoids   . Herniated disc,  cervical    c6-7  . Hx of spinal stenosis   . Hypoglycemic reaction   . Mitral valve prolapse   . Osteopenia    -1.7 2/05; -1.4 3/07; -2.2 7/11  . Vitamin D deficiency    26 4/10; 28 5/11    Current Medications, Allergies, Past Surgical History, Family History and Social History were reviewed in [employer] record.   Current Outpatient Medications  Medication Sig Dispense Refill  . ergocalciferol (DRISDOL) 1.25 MG (50000 UT) capsule Take 1 capsule (50,000 Units total) by mouth once a week. 4 capsule 2  . pantoprazole (PROTONIX) 40 MG tablet Take 1 tablet (40 mg total) by mouth daily. 30 tablet 3  . polyethylene glycol (MIRALAX) 17 g packet Take 17 g by mouth daily.     No current facility-administered medications for this visit.    Review of Systems: No chest pain. No shortness of breath.   PHYSICAL EXAM :    Wt Readings from Last 3 Encounters:  09/12/20 108 lb (49 kg)  08/24/20 108 lb (49 kg)  08/15/20 110 lb (49.9 kg)    BP 110/68   Pulse 85   Ht 5' 3.75" (1.619 m)   Wt 108 lb (49 kg)   LMP 11/04/1996   BMI 18.68 kg/m  Constitutional:  Pleasant female in no acute distress. Psychiatric: Normal mood and affect. Behavior is normal. EENT: Pupils normal.  Conjunctivae are normal. No scleral icterus. Neck supple.  Cardiovascular: Normal rate, regular rhythm. No edema Pulmonary/chest: Effort normal and breath sounds normal. No wheezing, rales or rhonchi. Abdominal: Soft, nondistended, mild -moderate LLQ tenderness.  Bowel sounds active throughout. There are no masses palpable. No hepatomegaly. Neurological: Alert and oriented to person place and time. Skin: Skin is warm and dry. No rashes noted.  Tye Savoy, NP  09/12/2020, 3:01 PM

## 2020-09-13 LAB — URINALYSIS, ROUTINE W REFLEX MICROSCOPIC
Bilirubin Urine: NEGATIVE
Ketones, ur: NEGATIVE
Nitrite: POSITIVE — AB
Specific Gravity, Urine: 1.015 (ref 1.000–1.030)
Total Protein, Urine: NEGATIVE
Urine Glucose: NEGATIVE
Urobilinogen, UA: 0.2 (ref 0.0–1.0)
pH: 6 (ref 5.0–8.0)

## 2020-09-15 ENCOUNTER — Other Ambulatory Visit: Payer: Self-pay

## 2020-09-15 ENCOUNTER — Telehealth: Payer: Self-pay | Admitting: Nurse Practitioner

## 2020-09-15 MED ORDER — AMOXICILLIN-POT CLAVULANATE 875-125 MG PO TABS: 1.0000 | ORAL_TABLET | Freq: Two times a day (BID) | ORAL | 0 refills | Status: AC

## 2020-09-15 NOTE — Telephone Encounter (Signed)
Patient is having a particularly bad day with her abdominal pain. She is terrified of perforation. Afebrile. Some nausea with the waves of pain. Started this morning when she got up.  She asks if she can be started on antibiotics empirically. Her CT is on Monday. Presently on Macrobid for UTI. She has not heard back from Urology about the urinalysis as of yet.

## 2020-09-15 NOTE — Telephone Encounter (Signed)
Pt is requesting a call back from a nurse to discuss the CT scan she has scheduled for 09/18/2020, pt did not disclose further info

## 2020-09-15 NOTE — Telephone Encounter (Signed)
Patient is advised.  

## 2020-09-15 NOTE — Telephone Encounter (Signed)
Ok. Please start Augmentin 875 mg BID x 7 days. If CT scan negative then she can stop the Augmentin.  When urology gets back with her about the UA results she should let them know that she is on Augmentin for possible diverticulitis. Thanks, PG

## 2020-09-18 ENCOUNTER — Other Ambulatory Visit: Payer: Self-pay

## 2020-09-18 ENCOUNTER — Ambulatory Visit (INDEPENDENT_AMBULATORY_CARE_PROVIDER_SITE_OTHER)
Admission: RE | Admit: 2020-09-18 | Discharge: 2020-09-18 | Disposition: A | Discharge status: Home / Self Care | Payer: 59 | Source: Ambulatory Visit | Attending: Nurse Practitioner | Admitting: Nurse Practitioner

## 2020-09-18 ENCOUNTER — Telehealth: Payer: Self-pay | Admitting: Nurse Practitioner

## 2020-09-18 DIAGNOSIS — R1032 Left lower quadrant pain: Secondary | ICD-10-CM | POA: Diagnosis not present

## 2020-09-18 DIAGNOSIS — Z8719 Personal history of other diseases of the digestive system: Secondary | ICD-10-CM

## 2020-09-18 NOTE — Telephone Encounter (Signed)
Faxed to [facility] Attention Dr Abner Greenspan.

## 2020-09-18 NOTE — Telephone Encounter (Signed)
Patient wants to make sure labs for urinalysis were faxed to correct fax number [phone] attention Dr. Abner Greenspan.  Patient states that her MD has not received them yet.

## 2020-09-22 NOTE — Progress Notes (Signed)
Addendum: Reviewed and agree with assessment and management plan. I agree with indication for colonoscopy and plan for the same. Urinalysis revealed infection which is being addressed by urology. Valleri Hendricksen, Lajuan Lines, MD

## 2020-09-25 ENCOUNTER — Telehealth: Payer: Self-pay | Admitting: Nurse Practitioner

## 2020-09-25 NOTE — Telephone Encounter (Signed)
Patient called states she is having new\different symptoms since Saturday and would like to discuss them with you

## 2020-09-25 NOTE — Telephone Encounter (Signed)
No answer. Left a message of my returned call.

## 2020-09-25 NOTE — Telephone Encounter (Signed)
Pt returning your call

## 2020-11-14 ENCOUNTER — Other Ambulatory Visit: Payer: Self-pay | Admitting: Internal Medicine

## 2020-11-15 LAB — SARS CORONAVIRUS 2 (TAT 6-24 HRS): SARS Coronavirus 2: NEGATIVE

## 2020-11-16 ENCOUNTER — Encounter: Payer: Self-pay | Admitting: Internal Medicine

## 2020-11-16 ENCOUNTER — Other Ambulatory Visit: Payer: Self-pay

## 2020-11-16 ENCOUNTER — Ambulatory Visit (AMBULATORY_SURGERY_CENTER): Payer: 59 | Admitting: Internal Medicine

## 2020-11-16 VITALS — BP 118/82 | HR 84 | Temp 97.9°F | Resp 13 | Ht 63.0 in | Wt 108.0 lb

## 2020-11-16 DIAGNOSIS — K5730 Diverticulosis of large intestine without perforation or abscess without bleeding: Secondary | ICD-10-CM

## 2020-11-16 DIAGNOSIS — D120 Benign neoplasm of cecum: Secondary | ICD-10-CM

## 2020-11-16 DIAGNOSIS — D122 Benign neoplasm of ascending colon: Secondary | ICD-10-CM | POA: Diagnosis present

## 2020-11-16 HISTORY — PX: COLONOSCOPY: SHX174

## 2020-11-16 NOTE — Progress Notes (Signed)
Report to PACU, RN, vss, BBS= Clear.  

## 2020-11-16 NOTE — Op Note (Signed)
[place] Patient Name: Paige Lawrence Procedure Date: 11/16/2020 11:18 AM MRN: [id number] Endoscopist: Jerene Bears , MD Age: 65 Referring MD:  Date of Birth: May 04, 1956 Gender: Female Account #: [id number] Procedure:                Colonoscopy Indications:              Abdominal pain in the left lower quadrant, history                            of diverticulitis treated in Aug 2021, last                            colonoscopy Nov 2013 (non-adenomatous polyps                            removed) Medicines:                Monitored Anesthesia Care Procedure:                Pre-Anesthesia Assessment:                           - Prior to the procedure, a History and Physical                            was performed, and patient medications and                            allergies were reviewed. The patient's tolerance of                            previous anesthesia was also reviewed. The risks                            and benefits of the procedure and the sedation                            options and risks were discussed with the patient.                            All questions were answered, and informed consent                            was obtained. Prior Anticoagulants: The patient has                            taken no previous anticoagulant or antiplatelet                            agents. ASA Grade Assessment: II - A patient with                            mild systemic disease. After reviewing the risks  and benefits, the patient was deemed in                            satisfactory condition to undergo the procedure.                           After obtaining informed consent, the colonoscope                            was passed under direct vision. Throughout the                            procedure, the patient's blood pressure, pulse, and                            oxygen saturations were monitored continuously. The                             Olympus [id number]-[id number]) Colonoscope was                            introduced through the anus and advanced to the                            cecum, identified by appendiceal orifice and                            ileocecal valve. The colonoscopy was performed                            without difficulty. The patient tolerated the                            procedure well. The quality of the bowel                            preparation was good. The ileocecal valve,                            appendiceal orifice, and rectum were photographed. Scope In: 11:29:52 AM Scope Out: 11:45:35 AM Scope Withdrawal Time: 0 hours 9 minutes 7 seconds  Total Procedure Duration: 0 hours 15 minutes 43 seconds  Findings:                 The digital rectal exam was normal.                           A 3 mm polyp was found in the ileocecal valve. The                            polyp was sessile. The polyp was removed with a                            cold biopsy forceps. Resection and retrieval were  complete.                           A 3 mm polyp was found in the ascending colon. The                            polyp was sessile. The polyp was removed with a                            cold biopsy forceps. Resection and retrieval were                            complete.                           Multiple small and large-mouthed diverticula were                            found in the mid sigmoid colon and distal sigmoid                            colon. There was narrowing of the colon in                            association with the diverticular opening.                           Internal hemorrhoids were found during                            retroflexion. The hemorrhoids were medium-sized. Complications:            No immediate complications. Estimated Blood Loss:     Estimated blood loss: none. Impression:               - One 3 mm polyp at the  ileocecal valve, removed                            with a cold biopsy forceps. Resected and retrieved.                           - One 3 mm polyp in the ascending colon, removed                            with a cold biopsy forceps. Resected and retrieved.                           - Severe diverticulosis confined to the mid and                            distal sigmoid colon. There was narrowing of the                            colon in association with the diverticular opening  but no discrete strictures.                           - Internal hemorrhoids. Recommendation:           - Patient has a contact number available for                            emergencies. The signs and symptoms of potential                            delayed complications were discussed with the                            patient. Return to normal activities tomorrow.                            Written discharge instructions were provided to the                            patient.                           - Resume previous diet.                           - Continue present medications.                           - Await pathology results.                           - If ongoing LLQ pain or recurrent diverticulitis                            or worsening bladder involvement surgical                            consultation for sigmoid resection should be                            considered.                           - Repeat colonoscopy is recommended. The                            colonoscopy date will be determined after pathology                            results from today's exam become available for                            review. Jerene Bears, MD 11/16/2020 12:00:40 PM This report has been signed electronically.

## 2020-11-16 NOTE — Progress Notes (Signed)
Medical history reviewed with no changes noted. VS assessed by C.W 

## 2020-11-16 NOTE — Patient Instructions (Signed)
Handouts given on diverticulosis, hemorrhoids and polyps.  YOU HAD AN ENDOSCOPIC PROCEDURE TODAY: Refer to the procedure report and other information in the discharge instructions given to you for any specific questions about what was found during the examination. If this information does not answer your questions, please call [facility] office at [phone] to clarify.   YOU SHOULD EXPECT: Some feelings of bloating in the abdomen. Passage of more gas than usual. Walking can help get rid of the air that was put into your GI tract during the procedure and reduce the bloating. If you had a lower endoscopy (such as a colonoscopy or flexible sigmoidoscopy) you may notice spotting of blood in your stool or on the toilet paper. Some abdominal soreness may be present for a day or two, also.  DIET: Your first meal following the procedure should be a light meal and then it is ok to progress to your normal diet. A half-sandwich or bowl of soup is an example of a good first meal. Heavy or fried foods are harder to digest and may make you feel nauseous or bloated. Drink plenty of fluids but you should avoid alcoholic beverages for 24 hours. If you had a esophageal dilation, please see attached instructions for diet.    ACTIVITY: Your care partner should take you home directly after the procedure. You should plan to take it easy, moving slowly for the rest of the day. You can resume normal activity the day after the procedure however YOU SHOULD NOT DRIVE, use power tools, machinery or perform tasks that involve climbing or major physical exertion for 24 hours (because of the sedation medicines used during the test).   SYMPTOMS TO REPORT IMMEDIATELY: A gastroenterologist can be reached at any hour. Please call [phone]  for any of the following symptoms:  . Following lower endoscopy (colonoscopy, flexible sigmoidoscopy) Excessive amounts of blood in the stool  Significant tenderness, worsening of abdominal pains   Swelling of the abdomen that is new, acute  Fever of 100 or higher  FOLLOW UP:  If any biopsies were taken you will be contacted by phone or by letter within the next 1-3 weeks. Call [phone]  if you have not heard about the biopsies in 3 weeks.  Please also call with any specific questions about appointments or follow up tests.

## 2020-11-16 NOTE — Progress Notes (Signed)
Called to room to assist during endoscopic procedure.  Patient ID and intended procedure confirmed with present staff. Received instructions for my participation in the procedure from the performing physician.  

## 2020-11-21 ENCOUNTER — Telehealth: Payer: Self-pay | Admitting: *Deleted

## 2020-11-21 NOTE — Telephone Encounter (Signed)
Pt scheduled to see Dr. Hilarie Fredrickson 11/28/20@9 :30am for abd pain. Pt notified of appt via her personal voicemail. Pt knows we are also waiting on the biopsy results. Pt aware.

## 2020-11-21 NOTE — Telephone Encounter (Signed)
  Follow up Call-  Call back number 11/16/2020  Post procedure Call Back phone  # [phone]  Permission to leave phone message Yes  Some recent data might be hidden     Patient questions:  Do you have a fever, pain , or abdominal swelling? yes Pain Score  2   Have you tolerated food without any problems? Yes.    Have you been able to return to your normal activities? Yes.    Do you have any questions about your discharge instructions: Diet   No. Medications  No. Follow up visit  No.  Do you have questions or concerns about your Care? Yes  Dr Hilarie Fredrickson, patient continues to have mild discomfort. Will your nurse be calling to make follow-up or do I need to put in a recall, and if so, when?  Actions: * If pain score is 4 or above: No action needed, pain <4.  1. Have you developed a fever since your procedure? no  2.   Have you had an respiratory symptoms (SOB or cough) since your procedure? no  3.   Have you tested positive for COVID 19 since your procedure no  4.   Have you had any family members/close contacts diagnosed with the COVID 19 since your procedure?  no   If yes to any of these questions please route to Joylene John, RN and Joella Prince, RN

## 2020-11-21 NOTE — Telephone Encounter (Signed)
Thanks Paige Lawrence, we will wait on her path, but she can get an OV too  Thanks

## 2020-11-22 ENCOUNTER — Encounter: Payer: Self-pay | Admitting: *Deleted

## 2020-11-24 ENCOUNTER — Encounter: Payer: Self-pay | Admitting: Internal Medicine

## 2020-11-28 ENCOUNTER — Ambulatory Visit: Payer: 59 | Admitting: Internal Medicine

## 2020-12-01 ENCOUNTER — Telehealth: Payer: Self-pay | Admitting: *Deleted

## 2020-12-01 ENCOUNTER — Ambulatory Visit (INDEPENDENT_AMBULATORY_CARE_PROVIDER_SITE_OTHER): Payer: 59 | Admitting: Obstetrics and Gynecology

## 2020-12-01 ENCOUNTER — Encounter: Payer: Self-pay | Admitting: Obstetrics and Gynecology

## 2020-12-01 ENCOUNTER — Other Ambulatory Visit: Payer: Self-pay

## 2020-12-01 VITALS — BP 124/66 | HR 76 | Ht 63.0 in | Wt 106.0 lb

## 2020-12-01 DIAGNOSIS — Z01419 Encounter for gynecological examination (general) (routine) without abnormal findings: Secondary | ICD-10-CM

## 2020-12-01 DIAGNOSIS — N898 Other specified noninflammatory disorders of vagina: Secondary | ICD-10-CM | POA: Diagnosis not present

## 2020-12-01 MED ORDER — FLUCONAZOLE 150 MG PO TABS: 150.0000 mg | ORAL_TABLET | ORAL | 0 refills | Status: DC

## 2020-12-01 NOTE — Telephone Encounter (Signed)
Patient would prefer diflucan Rx be switched to [facility]. Rx sent.

## 2020-12-01 NOTE — Progress Notes (Signed)
Paige Lawrence Feb 11, 1956 [id number]  SUBJECTIVE:  65 y.o. G2P2 female for annual routine gynecologic exam. She has no gynecologic concerns.     Current Outpatient Medications  Medication Sig Dispense Refill  . ergocalciferol (DRISDOL) 1.25 MG (50000 UT) capsule Take 1 capsule (50,000 Units total) by mouth once a week. 4 capsule 2  . fluconazole (DIFLUCAN) 150 MG tablet Take one tablet today and may repeat on day 3. (Patient not taking: Reported on 11/16/2020) 2 tablet 0  . pantoprazole (PROTONIX) 40 MG tablet Take 1 tablet (40 mg total) by mouth daily. 30 tablet 3  . polyethylene glycol (MIRALAX / GLYCOLAX) 17 g packet Take 17 g by mouth daily.     No current facility-administered medications for this visit.   Allergies: Aspirin, Ibuprofen, Sulfa antibiotics, and Ciprofloxacin hcl  Patient's last menstrual period was 11/04/1996.  Past medical history,surgical history, problem list, medications, allergies, family history and social history were all reviewed and documented as reviewed in the [facility] chart.  ROS: Pertinent positives and negatives as reviewed in HPI.   OBJECTIVE:  BP 124/66 (BP Location: Right Arm)   Pulse 76   Ht 5\' 3"  (1.6 m)   Wt 106 lb (48.1 kg)   LMP 11/04/1996   BMI 18.78 kg/m  The patient appears well, alert, oriented x 3, in no distress. ENT normal.  Neck supple. No cervical or supraclavicular adenopathy or thyromegaly.  Lungs are clear, good air entry, no wheezes, rhonchi or rales. S1 and S2 normal, no murmurs, regular rate and rhythm.  Abdomen soft without tenderness, guarding, mass or organomegaly.  Neurological is normal, no focal findings.  BREAST EXAM: breasts appear normal, no suspicious masses, no skin or nipple changes or axillary nodes  PELVIC EXAM: VULVA: normal appearing vulva with no masses, tenderness or lesions, VAGINA: normal appearing vagina with normal color, thick white discharge, no lesions, CERVIX: normal appearing cervix without  discharge or lesions, UTERUS: uterus is normal size, shape, consistency and nontender, ADNEXA: normal adnexa in size, nontender and no masses.  Chaperone: Lamont Snowball, RN present during the examination  ASSESSMENT:  65 y.o. G2P2 here for annual gynecologic exam  PLAN:   1. Postmenopausal. No significant hot flashes or night sweats. No vaginal bleeding. No gynecologic concerns at this time other than vaginal discharge and irritation. Exam consistent with likely Candida.  Will empirically treat with fluconazole 150 mg p.o. every 72 hours x2 doses. Follow-up if no improvement in symptoms. She also but has been having ongoing issues after she found out she had a contrast dye allergy after having a CT to work-up abdominal pain found to have diverticulitis and bladder wall thickening. She is following with gastroenterology and urology. 2. Pap smear 11/2018. Not repeated today. No prior history of abnormal Pap smears. Next Pap smear due 11/2021 following the current screening guidelines calling for the 3-year interval. 3. Mammogram 09/2018. Encouraged to schedule a mammogram as she is overdue. Breast exam normal today. 4. Colonoscopy 2022. Recommended that she continue per the prescribed interval. 5. Osteopenia.  Last DEXA 2011.  She has not followed up for a DEXA despite recommendations to do so.  This is recommended to her again, especially with her risk factors for osteoporosis. She indicates that she would like to work on getting her other medical conditions stabilized before she proceeds with a repeat DEXA. 6. Health maintenance.  Recently had routine screening labs 08/2020 with her primary doctor including CBC, CMP, lipid profile, TSH, and vitamin D.  Low vitamin D level noted and her primary did put her on prescription vitamin D.   Return annually or sooner, prn.  Joseph Pierini MD  12/01/20

## 2020-12-08 ENCOUNTER — Other Ambulatory Visit (INDEPENDENT_AMBULATORY_CARE_PROVIDER_SITE_OTHER): Payer: 59 | Admitting: Internal Medicine

## 2020-12-08 ENCOUNTER — Other Ambulatory Visit: Payer: Self-pay

## 2020-12-08 DIAGNOSIS — E559 Vitamin D deficiency, unspecified: Secondary | ICD-10-CM

## 2020-12-09 LAB — VITAMIN D 25 HYDROXY (VIT D DEFICIENCY, FRACTURES): Vit D, 25-Hydroxy: 39 ng/mL (ref 30–100)

## 2020-12-20 ENCOUNTER — Other Ambulatory Visit: Payer: Self-pay | Admitting: Nurse Practitioner

## 2020-12-20 NOTE — Telephone Encounter (Signed)
MEDICATION REFILL REQUEST  Date of last office visit 09/12/20  Cancels/No show? Canceled 11/28/20 appointment  Future office visit scheduled? no  Please advise on refill.

## 2020-12-20 NOTE — Telephone Encounter (Signed)
Okay to refill plus 5 additional refills. Thanks

## 2021-03-23 IMAGING — CT CT ABD-PELV W/O CM
2 of 4 series · 16 of 46 positions shown, 18 images · non-contrast
Comparison: 07/26/2020 from [HOSPITAL]

CLINICAL DATA: Left lower quadrant pain for 3 weeks.
Diverticulosis.

EXAM:
CT ABDOMEN AND PELVIS WITHOUT CONTRAST
TECHNIQUE: Multidetector CT imaging of the abdomen and pelvis was performed
following the standard protocol without IV contrast.

[Series 2: abd/ pelvis · axial · 0.71mm/px · z∈[-704,-314]mm · 13 of 86 slices shown, 15 images]
[im 4/86  soft-tissue]
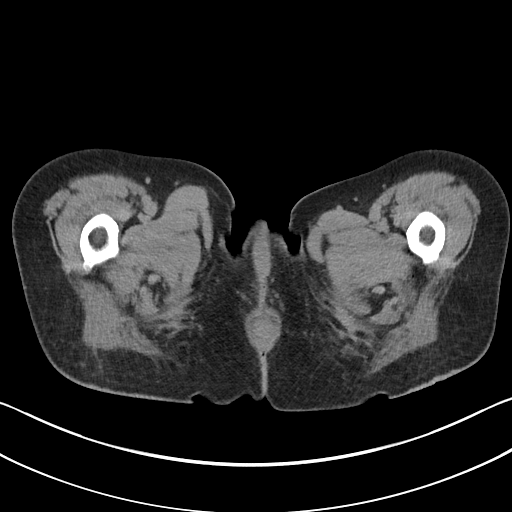
[im 4/86  bone]
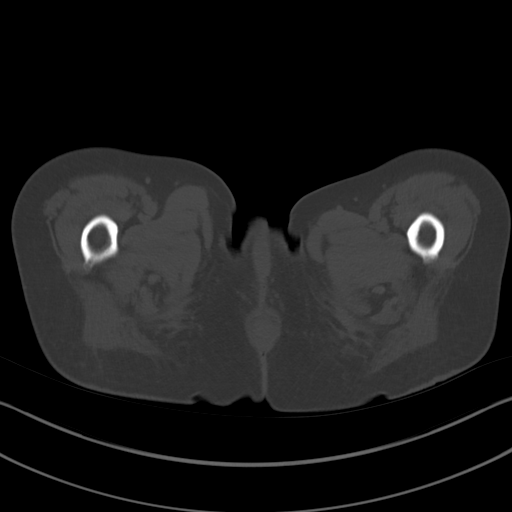
[im 11/86  soft-tissue]
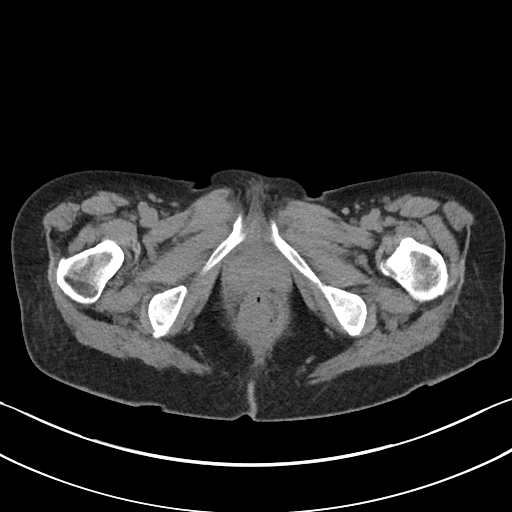
[im 18/86  soft-tissue]
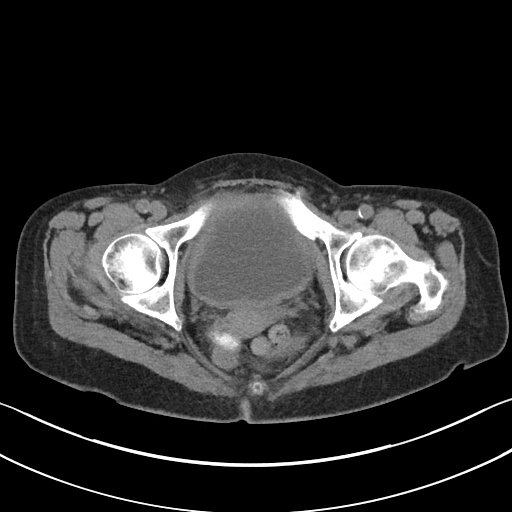
[im 25/86  soft-tissue]
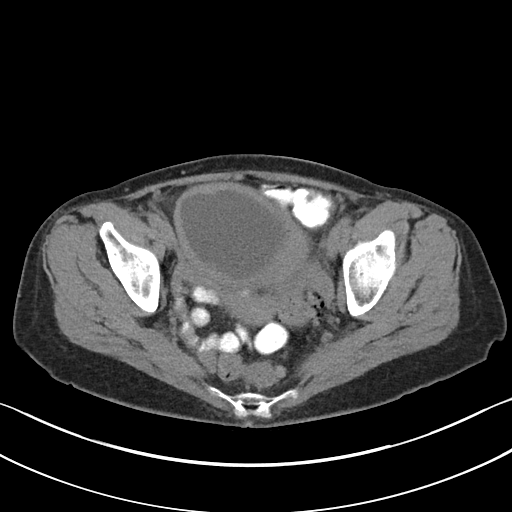
[im 29/86  soft-tissue]
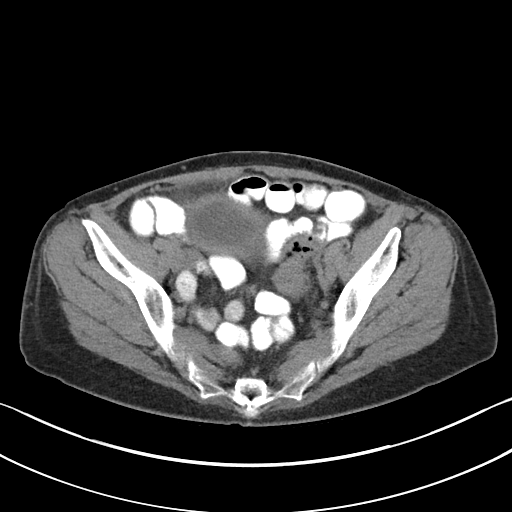
[im 36/86  soft-tissue]
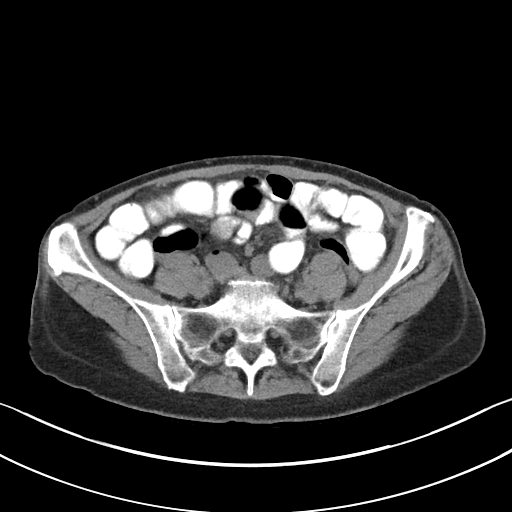
[im 43/86  soft-tissue]
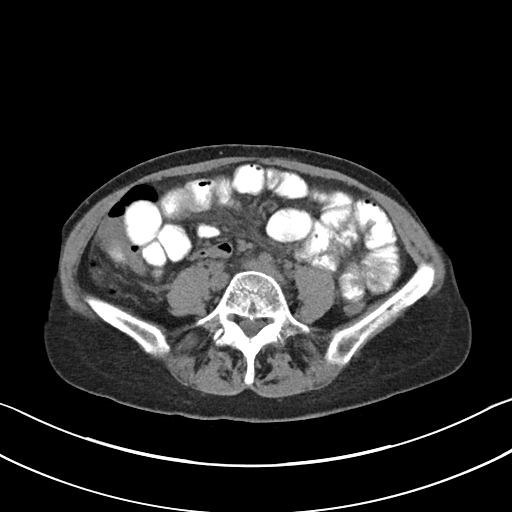
[im 50/86  soft-tissue]
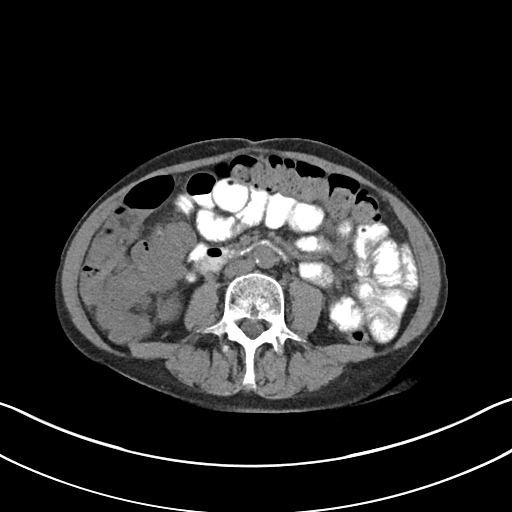
[im 57/86  soft-tissue]
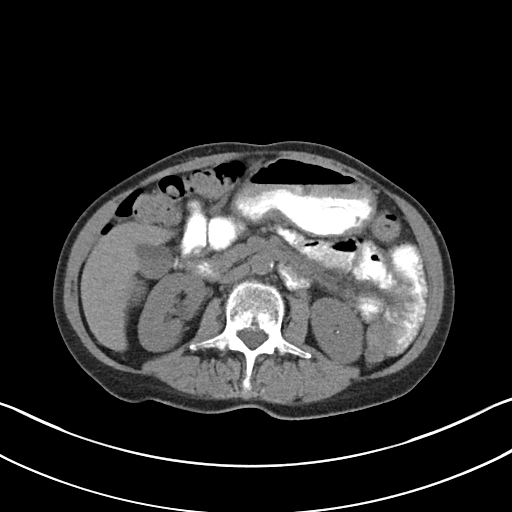
[im 57/86  bone]
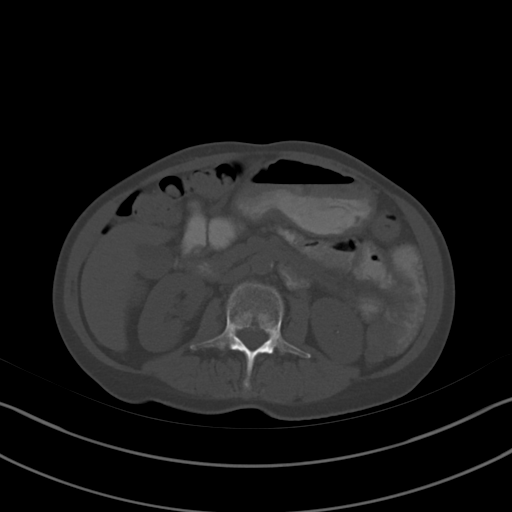
[im 61/86  soft-tissue]
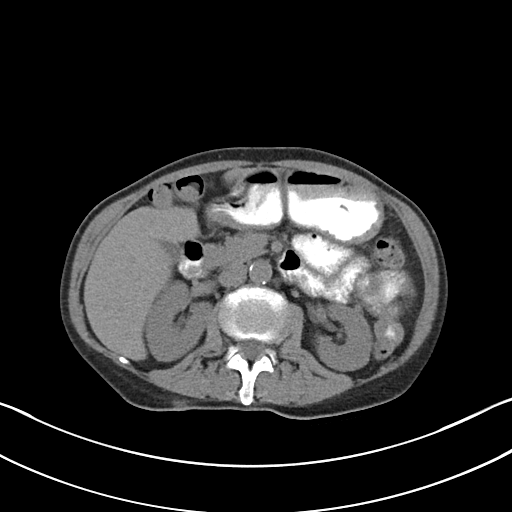
[im 68/86  soft-tissue]
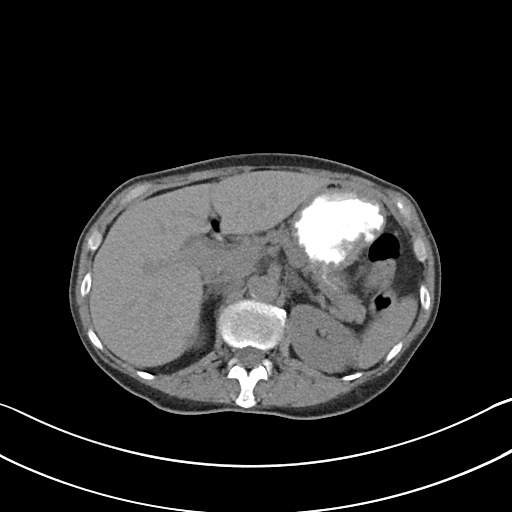
[im 75/86  soft-tissue]
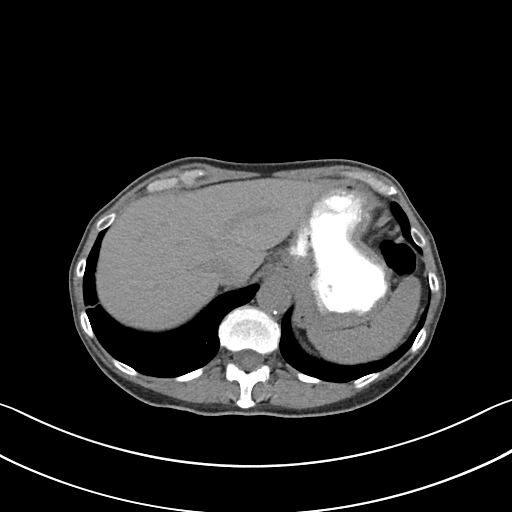
[im 82/86  soft-tissue]
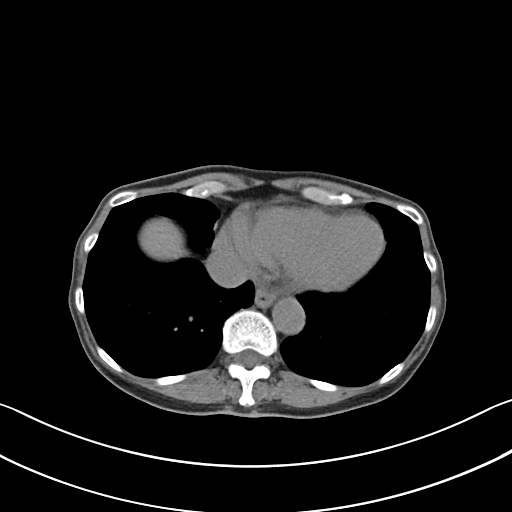

[Series 5: cor st · coronal · 0.77mm/px · 3 of 73 slices shown]
[im 25/73  soft-tissue]
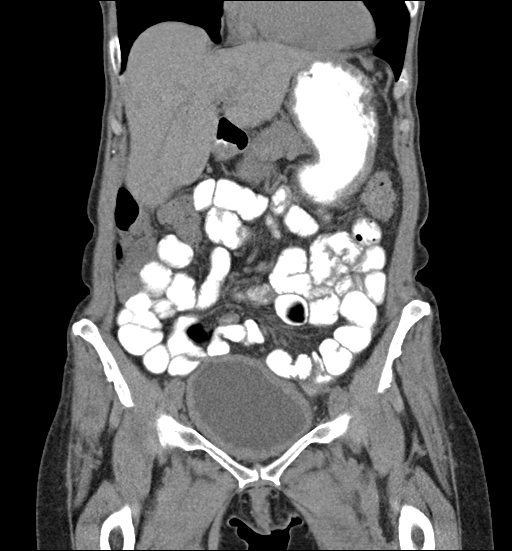
[im 33/73  soft-tissue]
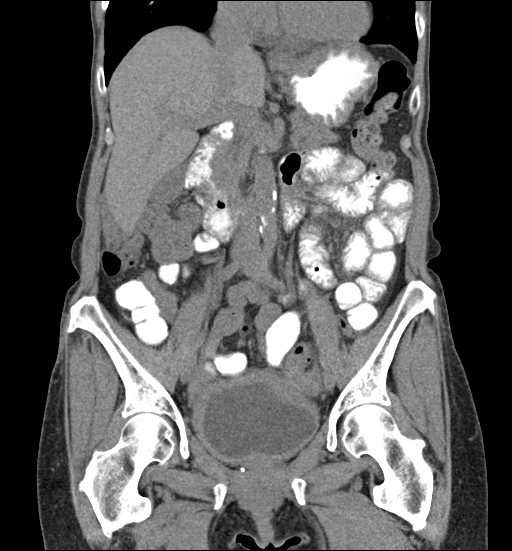
[im 41/73  soft-tissue]
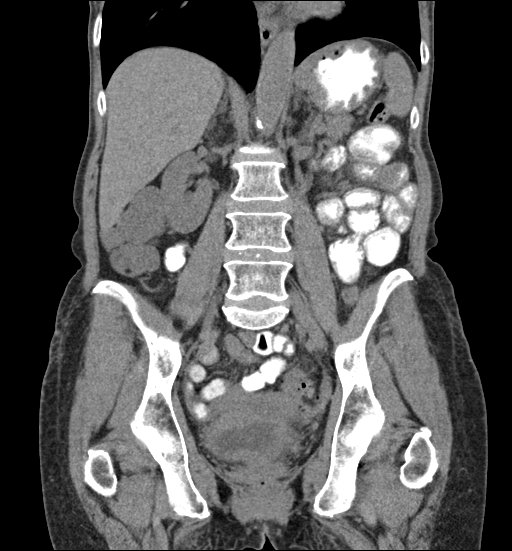

[16 of 46 positions shown; findings below may reference images not displayed]

FINDINGS: Lower chest: No acute findings.

Hepatobiliary: No mass visualized on this unenhanced exam. A tiny
less than 5 mm calcified gallstone is noted, however there is no
evidence of cholecystitis or biliary ductal dilatation.

Pancreas: No mass or inflammatory process visualized on this
unenhanced exam.

Spleen:  Within normal limits in size.

Adrenals/Urinary tract: A 2 mm calculus is again seen in the lower
pole of the left kidney. No evidence of ureteral calculi or
hydronephrosis. Mild-to-moderate diffuse bladder wall thickening is
unchanged, and may be due to chronic cystitis or neurogenic
bladder.

Stomach/Bowel: No evidence of obstruction, inflammatory process, or
abnormal fluid collections. Diverticulosis is seen mainly involving
the sigmoid colon, however there is no evidence of diverticulitis.

Vascular/Lymphatic: No pathologically enlarged lymph nodes
identified. No evidence of abdominal aortic aneurysm. Aortic
atherosclerotic calcification noted.

Reproductive: Tiny less than 1 cm calcified uterine fibroid is seen
posteriorly. Adnexal regions are unremarkable.

Other:  None.

Musculoskeletal:  No suspicious bone lesions identified.
IMPRESSION: Colonic diverticulosis. No radiographic evidence of diverticulitis
or other acute findings.

Tiny left renal calculus. No evidence of ureteral calculi or
hydronephrosis.

Stable mild-to-moderate diffuse bladder wall thickening, which could
be due to chronic cystitis or neurogenic bladder.

Cholelithiasis. No radiographic evidence of cholecystitis.

Tiny less than 1 cm calcified uterine fibroid.

Aortic Atherosclerosis ([id number]).

## 2021-04-20 ENCOUNTER — Other Ambulatory Visit: Payer: Self-pay

## 2021-04-20 ENCOUNTER — Ambulatory Visit
Admission: RE | Admit: 2021-04-20 | Discharge: 2021-04-20 | Disposition: A | Discharge status: Home / Self Care | Payer: 59 | Source: Ambulatory Visit | Attending: Internal Medicine | Admitting: Internal Medicine

## 2021-06-06 ENCOUNTER — Other Ambulatory Visit: Payer: Self-pay

## 2021-06-06 MED ORDER — PANTOPRAZOLE SODIUM 40 MG PO TBEC: 40.0000 mg | DELAYED_RELEASE_TABLET | Freq: Every day | ORAL | 5 refills | Status: DC

## 2021-07-17 ENCOUNTER — Telehealth: Payer: Self-pay | Admitting: Internal Medicine

## 2021-07-17 ENCOUNTER — Other Ambulatory Visit: Payer: Self-pay

## 2021-07-17 MED ORDER — AMOXICILLIN-POT CLAVULANATE 875-125 MG PO TABS: 1.0000 | ORAL_TABLET | Freq: Two times a day (BID) | ORAL | 0 refills | Status: DC

## 2021-07-17 NOTE — Telephone Encounter (Signed)
Pt was given Dr. Hilarie Fredrickson recommendations. Prescription sent to pharmacy. Pt made aware.

## 2021-07-17 NOTE — Telephone Encounter (Signed)
Pt States her diverticulitis  is beginning to flare. She is having LLQ abd pain for three days. Pt states that she has had a history of diverticulitis and this feels the same. Rates pain 2 to 6. Pt has  no diarrhea,  nausea or vomiting. Please Advise.

## 2021-07-17 NOTE — Telephone Encounter (Signed)
Patient up-to-date with colonoscopy, last colonoscopy earlier this year  Given history of diverticulosis and prior diverticulitis I think it is reasonable to treat with antibiotics.  She has a Cipro allergy. She has follow-up with Carl Best, NP early next month which she should plan on keeping  Augmentin 875 mg twice daily x7 days Consider probiotic for 2 to 4 weeks given diverticulitis and the need for antibiotic  If symptoms fail to improve she needs to notify us immediately  Lajuan Lines. Latora Quarry, M.D.  07/17/2021

## 2021-07-17 NOTE — Telephone Encounter (Signed)
Inbound call from patient. States her diverticilis is beginning to flare. She is having LLQ abd pain. No diarrhea, no nausea, no vomit. Best contact number [phone]

## 2021-08-09 ENCOUNTER — Ambulatory Visit (INDEPENDENT_AMBULATORY_CARE_PROVIDER_SITE_OTHER): Payer: 59 | Admitting: Nurse Practitioner

## 2021-08-09 ENCOUNTER — Encounter: Payer: Self-pay | Admitting: Nurse Practitioner

## 2021-08-09 VITALS — BP 130/80 | HR 80 | Ht 63.0 in | Wt 108.5 lb

## 2021-08-09 DIAGNOSIS — K5732 Diverticulitis of large intestine without perforation or abscess without bleeding: Secondary | ICD-10-CM | POA: Diagnosis not present

## 2021-08-09 DIAGNOSIS — D122 Benign neoplasm of ascending colon: Secondary | ICD-10-CM

## 2021-08-09 DIAGNOSIS — Z8719 Personal history of other diseases of the digestive system: Secondary | ICD-10-CM

## 2021-08-09 NOTE — Patient Instructions (Signed)
If you are age 65 or younger, your body mass index should be between 19-25. Your Body mass index is 19.22 kg/m. If this is out of the aformentioned range listed, please consider follow up with your Primary Care Provider.   The [facility] providers would like to encourage you to use [facility] to communicate with providers for non-urgent requests or questions.  Due to long hold times on the telephone, sending your provider a message by [facility] may be faster and more efficient way to get a response. Please allow 48 business hours for a response.  Please remember that this is for non-urgent requests/questions.  RECOMMENDATIONS: Fiber diet as tolerated. Continue Miralax daily as needed. Avoid constipation. Please call our office if your symptoms worsen.  It was great seeing you today! Thank you for entrusting me with your care and choosing [facility].  Noralyn Pick, CRNP

## 2021-08-09 NOTE — Progress Notes (Signed)
08/09/2021 Paige Lawrence [id number] 02-Apr-1956   Chief Complaint: Diverticulitis follow-up  History of Present Illness: Paige Lawrence. Paige Lawrence is a 65 year old female with a past medical history of colon polyps and diverticulitis 06/2020.  She is followed by Dr. Hilarie Fredrickson.  She developed recurrence of LLQ pain for approximately 7 days and she contacted our office on 07/17/2021 she was concerned she was having a flare of diverticulitis.  She was prescribed Augmentin 875 mg 1 p.o. twice daily per Dr. Hilarie Fredrickson and to follow-up in the office in a few weeks with instructions to call if her symptoms persisted or worsened.  Her LLQ pain significantly decreased by the third day of Augmentin and completely abated by the seventh day.  She denies having any recent constipation issues prior to the onset of her LLQ pain.  She takes MiraLAX nightly which results in passing a normal soft brown bowel movement daily without straining.  No rectal bleeding.  Her most recent colonoscopy was 11/16/2020 which identified 2 tubular adenomatous polyps which were removed from the IC valve and ascending colon.  Severe diverticulosis was present to the mid and distal sigmoid colon with associated diverticular narrowing without stricture or obstruction.  CBC Latest Ref Rng & Units 08/21/2020 08/15/2020 06/13/2020  WBC 3.8 - 10.8 Thousand/uL 6.9 8.7 13.6(H)  Hemoglobin 11.7 - 15.5 g/dL 15.1 14.9 15.5(H)  Hematocrit 35.0 - 45.0 % 44.6 44.5 44.9  Platelets 140 - 400 Thousand/uL 386 418(H) 231    CMP Latest Ref Rng & Units 08/21/2020 08/15/2020 07/26/2020  Glucose 65 - 99 mg/dL 87 82 -  BUN 7 - 25 mg/dL 5(L) 4(L) -  Creatinine 0.50 - 0.99 mg/dL 0.74 0.69 0.70  Sodium 135 - 146 mmol/L 140 141 -  Potassium 3.5 - 5.3 mmol/L 4.7 4.1 -  Chloride 98 - 110 mmol/L 102 102 -  CO2 20 - 32 mmol/L 27 25 -  Calcium 8.6 - 10.4 mg/dL 9.9 9.2 -  Total Protein 6.1 - 8.1 g/dL 6.9 - -  Total Bilirubin 0.2 - 1.2 mg/dL 0.5 - -  Alkaline Phos 38 - 126  U/L - - -  AST 10 - 35 U/L 13 - -  ALT 6 - 29 U/L 6 - -     Past Medical History:  Diagnosis Date   Aortic atherosclerosis (HCC)    Cholelithiasis    Colon polyps    DDD (degenerative disc disease), lumbar    Diverticulosis    GERD (gastroesophageal reflux disease)    Hemorrhoids    Herniated disc, cervical    c6-7   Hiatal hernia    Hx of spinal stenosis    Hypoglycemic reaction    Internal hemorrhoids    Mitral valve prolapse    Osteopenia    -1.7 2/05; -1.4 3/07; -2.2 7/11   Uterine fibroid    Vitamin D deficiency    26 4/10; 28 5/11   Past Surgical History:  Procedure Laterality Date   CESAREAN SECTION     COLONOSCOPY     WISDOM TOOTH EXTRACTION     Colonoscopy 11/16/2020: - One 3 mm polyp at the ileocecal valve, removed with a cold biopsy forceps. Resected and retrieved. - One 3 mm polyp in the ascending colon, removed with a cold biopsy forceps. Resected and retrieved. retrieved. - Severe diverticulosis confined to the mid and distal sigmoid colon. There was narrowing of the colon in association with the diverticular opening but no discrete strictures. - Internal hemorrhoids - Recall  colonoscopy 7 years Surgical [P], small bowel, ileocecal valve; colon, ascending, polyp (2) - TUBULAR ADENOMA, NEGATIVE FOR HIGH GRADE DYSPLASIA (X1). - COLONIC MUCOSA WITH UNDERLYING LYMPHOID AGGREGATE, NEGATIVE FOR DYSPLASIA (X1).  Current Medications, Allergies, Past Medical History, Past Surgical History, Family History and Social History were reviewed in [employer] record.   Review of Systems:   Constitutional: Negative for fever, sweats, chills or weight loss.  Respiratory: Negative for shortness of breath.   Cardiovascular: Negative for chest pain, palpitations and leg swelling.  Gastrointestinal: See HPI.  Musculoskeletal: Negative for back pain or muscle aches.  Neurological: Negative for dizziness, headaches or paresthesias.   Physical  Exam: BP 130/80 (BP Location: Left Arm, Patient Position: Sitting, Cuff Size: Normal)   Pulse 80   Ht 5\' 3"  (1.6 m) Comment: height measured without shoes  Wt 108 lb 8 oz (49.2 kg)   LMP 11/04/1996   BMI 19.22 kg/m  General: 65 year old female in no acute distress. Head: Normocephalic and atraumatic. Eyes: No scleral icterus. Conjunctiva pink . Ears: Normal auditory acuity. Lungs: Clear throughout to auscultation. Heart: Regular rate and rhythm, no murmur. Abdomen: Soft, nontender and nondistended. No masses or hepatomegaly. Normal bowel sounds x 4 quadrants.  Rectal: Deferred. Musculoskeletal: Symmetrical with no gross deformities. Extremities: No edema. Neurological: Alert oriented x 4. No focal deficits.  Psychological: Alert and cooperative. Normal mood and affect  Assessment and Recommendations:  56) 65 year old female with a history of sigmoid diverticulitis 06/2020 with recurrent LLQ pain x 7 days which resolved after she took Augmentin 875 mg p.o. twice daily for 7 days -Patient to continue MiraLAX indefinitely as tolerated secondary to significant diverticular disease with associated diverticular narrowing without stricture per colonoscopy 11/2020 -Patient to call our office if LLQ pain recurs -Laboratory studies not done in the office today as her LLQ pain abated and her abdominal exam was negative -Fiber diet as tolerated -Drink 8 glasses ( 64 oz) of water daily  2) History of two 3 mm tubular adenomatous polyps per colonoscopy 11/2020.  Father with history of colon polyps.  Maternal aunt with history of colon cancer. -Next colonoscopy due 11/2027

## 2021-08-29 NOTE — Progress Notes (Signed)
Addendum: Reviewed and agree with assessment and management plan. Antolin Belsito M, MD  

## 2021-12-06 ENCOUNTER — Encounter: Payer: Self-pay | Admitting: Radiology

## 2021-12-11 ENCOUNTER — Ambulatory Visit (INDEPENDENT_AMBULATORY_CARE_PROVIDER_SITE_OTHER): Payer: Medicare Other | Admitting: Radiology

## 2021-12-11 ENCOUNTER — Other Ambulatory Visit: Payer: Self-pay

## 2021-12-11 ENCOUNTER — Other Ambulatory Visit (HOSPITAL_COMMUNITY)
Admission: RE | Admit: 2021-12-11 | Discharge: 2021-12-11 | Disposition: A | Discharge status: Home / Self Care | Payer: Self-pay | Source: Ambulatory Visit | Attending: Radiology | Admitting: Radiology

## 2021-12-11 ENCOUNTER — Encounter: Payer: Self-pay | Admitting: Radiology

## 2021-12-11 VITALS — BP 110/64 | HR 74 | Resp 16 | Ht 62.75 in | Wt 110.0 lb

## 2021-12-11 DIAGNOSIS — Z124 Encounter for screening for malignant neoplasm of cervix: Secondary | ICD-10-CM | POA: Diagnosis not present

## 2021-12-11 DIAGNOSIS — Z9189 Other specified personal risk factors, not elsewhere classified: Secondary | ICD-10-CM | POA: Diagnosis not present

## 2021-12-11 DIAGNOSIS — M85811 Other specified disorders of bone density and structure, right shoulder: Secondary | ICD-10-CM | POA: Diagnosis not present

## 2021-12-11 DIAGNOSIS — Z01419 Encounter for gynecological examination (general) (routine) without abnormal findings: Secondary | ICD-10-CM

## 2021-12-11 DIAGNOSIS — Z716 Tobacco abuse counseling: Secondary | ICD-10-CM | POA: Diagnosis not present

## 2021-12-11 NOTE — Progress Notes (Signed)
° °        ° °  Paige Lawrence 02/23/1956 [id number]    Subjective:     Paige Lawrence is a 66 y.o. female here for a routine exam.  Current complaints: no.  Personal health questionnaire reviewed: yes.  Do you have a primary care provider? yes How many times per week do you exercise? 1-2 (gardening)    Gynecologic History Patient's last menstrual period was 11/04/1996. Contraception: post menopausal status Last Pap: 11/16/18. Results were: normal Last mammogram: 04/20/21. Results were: normal  Obstetric History OB History  Gravida Para Term Preterm AB Living  2 2       2   SAB IAB Ectopic Multiple Live Births               # Outcome Date GA Lbr Len/2nd Weight Sex Delivery Anes PTL Lv  2 Para           1 Para              The following portions of the patient's history were reviewed and updated as appropriate: allergies, current medications, past family history, past medical history, past social history, past surgical history, and problem list.  Review of Systems    Objective:    LMP 11/04/1996   General Appearance:    Alert, cooperative, no distress, appears stated age  Head:    Normocephalic, without obvious abnormality, atraumatic  Eyes:    PERRL, conjunctiva/corneas clear, EOM's intact, fundi    benign, both eyes  Ears:    Normal TM's and external ear canals, both ears  Nose:   Nares normal, septum midline, mucosa normal, no drainage    or sinus tenderness  Throat:   Lips, mucosa, and tongue normal; teeth and gums normal  Neck:   Supple, symmetrical, trachea midline, no adenopathy;    thyroid:  no enlargement/tenderness/nodules; no carotid   bruit or JVD  Back:     Symmetric, no curvature, ROM normal, no CVA tenderness  Lungs:     Clear to auscultation bilaterally, respirations unlabored  Chest Wall:    No tenderness or deformity   Heart:    Regular rate and rhythm, S1 and S2 normal, no murmur, rub   or gallop  Breast Exam:    No tenderness, masses, or nipple  abnormality  Abdomen:     Soft, non-tender, bowel sounds active all four quadrants,    no masses, no organomegaly  Genitalia:    Normal female without lesion, discharge or tenderness    Uterus midline, small, mobile. No adnexal masses.  Extremities:   Extremities normal, atraumatic, no cyanosis or edema  Pulses:   2+ and symmetric all extremities  Skin:   Skin color, texture, turgor normal, no rashes or lesions  Lymph nodes:   Cervical, supraclavicular, and axillary nodes normal  Neurologic:   CNII-XII intact, normal strength, sensation and reflexes    throughout          Assessment/Plan:   1. Encounter for well woman exam with routine gynecological exam Pap today Mammo due 6/23 Schedule DEXA( overdue) Colonoscopy up to date  Smoking cessation reviewed SBE Regular exercise encouraged    Follow up in: 1  year  or as needed.   Deshea Pooley B, NP 2:47 PM

## 2021-12-12 ENCOUNTER — Other Ambulatory Visit: Payer: Self-pay

## 2021-12-12 DIAGNOSIS — M8588 Other specified disorders of bone density and structure, other site: Secondary | ICD-10-CM

## 2021-12-12 LAB — CYTOLOGY - PAP
Comment: NEGATIVE
Diagnosis: NEGATIVE
High risk HPV: NEGATIVE

## 2021-12-12 NOTE — Progress Notes (Signed)
Please use screening code for osteopenia.

## 2021-12-20 ENCOUNTER — Other Ambulatory Visit: Payer: Self-pay | Admitting: Nurse Practitioner

## 2022-01-08 ENCOUNTER — Other Ambulatory Visit: Payer: Self-pay | Admitting: Radiology

## 2022-01-08 ENCOUNTER — Other Ambulatory Visit: Payer: Self-pay

## 2022-01-08 ENCOUNTER — Ambulatory Visit (INDEPENDENT_AMBULATORY_CARE_PROVIDER_SITE_OTHER): Payer: Medicare Other

## 2022-01-08 DIAGNOSIS — Z780 Asymptomatic menopausal state: Secondary | ICD-10-CM | POA: Diagnosis not present

## 2022-01-08 DIAGNOSIS — M810 Age-related osteoporosis without current pathological fracture: Secondary | ICD-10-CM

## 2022-01-08 DIAGNOSIS — M8588 Other specified disorders of bone density and structure, other site: Secondary | ICD-10-CM

## 2022-01-14 ENCOUNTER — Encounter: Payer: Self-pay | Admitting: Obstetrics & Gynecology

## 2022-01-14 ENCOUNTER — Ambulatory Visit (INDEPENDENT_AMBULATORY_CARE_PROVIDER_SITE_OTHER): Payer: Medicare Other | Admitting: Obstetrics & Gynecology

## 2022-01-14 ENCOUNTER — Other Ambulatory Visit: Payer: Self-pay

## 2022-01-14 VITALS — BP 128/82 | HR 87 | Ht 63.0 in | Wt 107.0 lb

## 2022-01-14 DIAGNOSIS — Z780 Asymptomatic menopausal state: Secondary | ICD-10-CM

## 2022-01-14 DIAGNOSIS — M810 Age-related osteoporosis without current pathological fracture: Secondary | ICD-10-CM

## 2022-01-14 MED ORDER — ALENDRONATE SODIUM 70 MG PO TABS: 70.0000 mg | ORAL_TABLET | ORAL | 4 refills | Status: DC

## 2022-01-14 NOTE — Progress Notes (Signed)
? ? ?  Paige Lawrence 07-18-1956 [id number] ? ? ?     66 y.o.  G2P2L2 ? ?RP: Counseling and management of Osteoporosis ? ?HPI: Bone Density on 01/08/2022 showed Osteoporosis with a T-Score of -3.1 at the AP Spine.  No current fracture.  No problem with balance.  Probably not taking enough Ca++.  Vit D supplement.  Planning to increase walking with her neighbor who walks 3 times a day.  Patient walks her dog. ? ? ?OB History  ?Gravida Para Term Preterm AB Living  ?'2 2       2  '$ ?SAB IAB Ectopic Multiple Live Births  ?           ?  ?# Outcome Date GA Lbr Len/2nd Weight Sex Delivery Anes PTL Lv  ?2 Para           ?1 Para           ? ? ?Past medical history,surgical history, problem list, medications, allergies, family history and social history were all reviewed and documented in the EPIC chart. ? ? ?Directed ROS with pertinent positives and negatives documented in the history of present illness/assessment and plan. ? ?Exam: ? ?Vitals:  ? 01/14/22 1100  ?BP: 128/82  ?Pulse: 87  ?SpO2: 99%  ?Weight: 107 lb (48.5 kg)  ?Height: '5\' 3"'$  (1.6 m)  ? ?General appearance:  Normal ? ?Bone Density 3/72023:  Osteoporosis with T-Score of -3.1 at the AP Spine.  All other sites with Osteopenia.  Significant decrease in BD at all sites c/w BD in 2011. ? ? ?Assessment/Plan:  66 y.o. G2P2L2 ? ?1. Age-related osteoporosis without current pathological fracture ?Bone Density 3/72023:  Osteoporosis with T-Score of -3.1 at the AP Spine.  All other sites with Osteopenia.  Significant decrease in BD at all sites c/w BD in 2011. Bone Density on 01/08/2022 showed Osteoporosis with a T-Score of -3.1 at the AP Spine.  No current fracture.  No problem with balance.  Probably not taking enough Ca++.  Recommend a total of 1.5 g/d of Ca++.  Vit D supplement.  Planning to increase walking with her neighbor who walks 3 times a day.  Patient walks her dog.  Counseling on Bone medication.  Decision to start on Fosamax 70 mg PO daily.  Usage reviewed.  Risks and  benefits thoroughly reviewed.  Recommend to stop Fosamax 1 month before and after if has major dental work, to reduce the risk of necrosis of the jaw.  Patient has GE Reflux under control with Rx.  Will call me if not tolerating Fosamax. ? ?2. Postmenopause ?Well on no HRT.  No PMB. ? ?Other orders ?- alendronate (FOSAMAX) 70 MG tablet; Take 1 tablet (70 mg total) by mouth every 7 (seven) days. Take with a full glass of water on an empty stomach.  ? ?Princess Bruins MD, 11:08 AM 01/14/2022 ? ? ? ?  ?

## 2022-06-24 ENCOUNTER — Telehealth: Payer: Self-pay | Admitting: Internal Medicine

## 2022-06-24 NOTE — Telephone Encounter (Signed)
scheduled

## 2022-06-24 NOTE — Telephone Encounter (Signed)
[name] [phone]  Paige Lawrence called to say her right leg started hurting on Saturday . It only hurts when she walks and she has also found a knot behind the knee a little ways down. It is not red or swollen, she would like for you to take a look at it, I let her know it may be tomorrow, because we are booked for today.

## 2022-06-25 ENCOUNTER — Ambulatory Visit (INDEPENDENT_AMBULATORY_CARE_PROVIDER_SITE_OTHER): Payer: Medicare Other | Admitting: Internal Medicine

## 2022-06-25 ENCOUNTER — Encounter: Payer: Self-pay | Admitting: Internal Medicine

## 2022-06-25 VITALS — BP 118/72 | HR 82 | Temp 98.5°F | Wt 105.8 lb

## 2022-06-25 DIAGNOSIS — F411 Generalized anxiety disorder: Secondary | ICD-10-CM | POA: Diagnosis not present

## 2022-06-25 DIAGNOSIS — M7918 Myalgia, other site: Secondary | ICD-10-CM

## 2022-06-25 DIAGNOSIS — M7122 Synovial cyst of popliteal space [Baker], left knee: Secondary | ICD-10-CM

## 2022-06-25 DIAGNOSIS — Z87891 Personal history of nicotine dependence: Secondary | ICD-10-CM | POA: Diagnosis not present

## 2022-06-25 MED ORDER — MELOXICAM 7.5 MG PO TABS: 7.5000 mg | ORAL_TABLET | Freq: Every day | ORAL | 0 refills | Status: DC

## 2022-06-25 MED ORDER — ALPRAZOLAM 0.5 MG PO TABS: 0.5000 mg | ORAL_TABLET | Freq: Two times a day (BID) | ORAL | 0 refills | Status: DC | PRN

## 2022-06-25 NOTE — Progress Notes (Signed)
   Subjective:    Patient ID: Paige Lawrence, female    DOB: May 18, 1956, 66 y.o.   MRN: [id number]  HPI 66 year old Female not seen here since 2021 when she was seen for health maintenance exam.  In October 2021 when she was admitted to [facility] in [place] for small bowel obstruction and urinary retention.  She was also found to have a urinary tract infection.  She was diagnosed August 11 with diverticulitis at [facility] and was treated as an outpatient subsequently developed a rash on August 14 and went to [facility].  She had been started on Cipro and metronidazole for diverticulitis and abdominal pain improved.  Most likely was thought to have had a drug rash from Cipro and was given Augmentin and sent home with a prescription for Augmentin and was advised to continue Xyzal and Benadryl.  We did not hear from her until August 30.  Prior to that we had not seen the patient in 4 years.  Was admitted to [facility] October 1 through August 06, 2020 for small bowel obstruction and urinary retention.  A Foley catheter was placed and she subsequently had follow-up with [facility].  Urologist felt that urinary retention was related to inflammation from underlying diverticulitis.  She also had cystoscopy to rule out bladder cancer no suspicious lesions were identified.  There appeared to be a significant amount of inflammatory reaction due to indwelling Foley catheter.  She was subsequently seen here August 15, 2020 complaining of right rib pain and right rib detail film was negative.  Remote history of GE reflux, spinal stenosis, herniated cervical disc at C6-C7, osteopenia, mitral valve prolapse and history of C-section.  Social history: 1 daughter.  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  She resides alone.  Family history: Colon cancer in maternal aunt.  Colon polyps in father.  Heart disease in brother.  Hypertension in  mother.  Complaint today is that right leg started hurting on Saturday.  It only hurt when she was walking and she noted a knot behind her knee.  It was not red or swollen but she was concerned that it could be some sort of blood clot.  Review of Systems no fever, chills, nausea or vomiting     Objective:   Physical Exam  Vital signs reviewed.  Blood pressure 118/72 pulse 82 temperature 98.5 degrees pulse oximetry 97% weight 105 pounds 12.8 ounces BMI 18.74  Examination of the knee is unremarkable except for a popliteal cyst.  She has good range of motion in her knee and no tenderness of the medial or lateral collateral ligaments.  Knee is stable.      Assessment & Plan:  Popliteal cyst-right popliteal area.  This does not represent a blood clot.  Musculoskeletal pain-have prescribed Mobic 7.5 mg daily.  History of vitamin D deficiency previously prescribed high-dose vitamin D in 2021  Dr. Dellis Filbert prescribed Fosamax for osteopenia in 2023  For anxiety have prescribed Xanax 0.5 mg twice daily as needed for anxiety (number 60 tablets) with no refill.

## 2022-06-25 NOTE — Patient Instructions (Addendum)
Meloxicam 7.5 mg daily with a meal for musculoskeletal pain until pain has improved. Take Xanax sparingly for anxiety.  You have a benign popliteal cyst behind your right kneecap in the popliteal area.  Recommend ice and taking meloxicam.  Please quit smoking.  Please plan to schedule health maintenance exam in the near future

## 2022-06-27 ENCOUNTER — Other Ambulatory Visit: Payer: Self-pay | Admitting: Nurse Practitioner

## 2022-08-16 ENCOUNTER — Other Ambulatory Visit: Payer: Self-pay | Admitting: Internal Medicine

## 2022-08-16 ENCOUNTER — Other Ambulatory Visit: Payer: Self-pay

## 2022-08-16 ENCOUNTER — Telehealth: Payer: Self-pay | Admitting: Nurse Practitioner

## 2022-08-16 DIAGNOSIS — Z1231 Encounter for screening mammogram for malignant neoplasm of breast: Secondary | ICD-10-CM

## 2022-08-16 DIAGNOSIS — Z8719 Personal history of other diseases of the digestive system: Secondary | ICD-10-CM

## 2022-08-16 DIAGNOSIS — R1032 Left lower quadrant pain: Secondary | ICD-10-CM

## 2022-08-16 MED ORDER — AMOXICILLIN-POT CLAVULANATE 875-125 MG PO TABS: 1.0000 | ORAL_TABLET | Freq: Two times a day (BID) | ORAL | 0 refills | Status: DC

## 2022-08-16 NOTE — Telephone Encounter (Signed)
Inbound call from patient stating she is having a diverticulitis flare up and would like something prescribed. Please give patient a call back to further advise. Thank you

## 2022-08-16 NOTE — Telephone Encounter (Signed)
Paige Lawrence, pls send patient to our lab for CBC, CMP and CRP level. Schedule her for CTAP with oral contrast only asap (she is allergic to IV contrast). Send in RX for Augmentin '875mg'$  one po bid x 10 days # 20, no refills. Patient to go to the ED if she develops worsening abdominal pain today or over the weekend. Patient to contact office Monday with symptoms update. Push fluids. Bland diet.

## 2022-08-16 NOTE — Telephone Encounter (Signed)
Patient called in with complaints of intermittent, dull, LLQ abdominal pain that started on Sunday 08/11/22 that she believes is a diverticulitis flare up given her history. Denies any n/v, fever. Bowel movements have been normal. Patient is asking if antibiotics can be sent in. Last seen in [place] on 08/09/21 with Jaclyn Shaggy, NP.

## 2022-08-16 NOTE — Telephone Encounter (Signed)
  Paige Lawrence, in addition to the below instructions pls schedule her for a follow up appt in office in 1 to 2 weeks. Thx

## 2022-08-16 NOTE — Telephone Encounter (Signed)
Spoke with patient regarding NP recommendations. She has been advised on when/where to go for lab work. CT is currently scheduled for 08/22/22 at [facility], however patient is requesting to reschedule. Scheduling number has been provided to patient to call and schedule a date that works best for her. Prescription sent to pharmacy. Follow up scheduled for 09/03/22 at 1:30 pm with Jaclyn Shaggy, NP. Pt verbalized all understanding.

## 2022-08-19 NOTE — Telephone Encounter (Signed)
Pati8ent called requesting to speak with you again regarding the same thing as the previous message.

## 2022-08-19 NOTE — Telephone Encounter (Signed)
Spoke with patient regarding NP recommendations. Scheduling number provided for patient if she choose to cancel.

## 2022-08-19 NOTE — Telephone Encounter (Signed)
Patient called in stating she has felt so much better & has not had any symptoms since Saturday morning. She has not started taking antibiotics yet, and would like to know NP recommendations if it is okay to hold off on medication since her symptoms have resolved? She still plans to come in for labs & tentatively go to CT as scheduled.

## 2022-08-19 NOTE — Telephone Encounter (Signed)
Paige Lawrence, good news patient's abdominal pain abated. She can proceed with getting the labs done. If her abdominal pain remains completely gone then cancel CTAP. Pt to keep her office follow up appt 09/03/2022 as scheduled. THX

## 2022-08-20 ENCOUNTER — Other Ambulatory Visit (INDEPENDENT_AMBULATORY_CARE_PROVIDER_SITE_OTHER): Payer: Self-pay

## 2022-08-20 DIAGNOSIS — R1032 Left lower quadrant pain: Secondary | ICD-10-CM

## 2022-08-20 DIAGNOSIS — Z8719 Personal history of other diseases of the digestive system: Secondary | ICD-10-CM

## 2022-08-20 LAB — COMPREHENSIVE METABOLIC PANEL
ALT: 13 U/L (ref 0–35)
AST: 19 U/L (ref 0–37)
Albumin: 4.4 g/dL (ref 3.5–5.2)
Alkaline Phosphatase: 94 U/L (ref 39–117)
BUN: 10 mg/dL (ref 6–23)
CO2: 28 mEq/L (ref 19–32)
Calcium: 9.8 mg/dL (ref 8.4–10.5)
Chloride: 101 mEq/L (ref 96–112)
Creatinine, Ser: 0.75 mg/dL (ref 0.40–1.20)
GFR: 83.3 mL/min (ref >60.00)
Glucose, Bld: 90 mg/dL (ref 70–99)
Potassium: 3.9 mEq/L (ref 3.5–5.1)
Sodium: 137 mEq/L (ref 135–145)
Total Bilirubin: 0.9 mg/dL (ref 0.2–1.2)
Total Protein: 7.4 g/dL (ref 6.0–8.3)

## 2022-08-20 LAB — CBC
HCT: 46 % (ref 36.0–46.0)
Hemoglobin: 15.9 g/dL — ABNORMAL HIGH (ref 12.0–15.0)
MCHC: 34.5 g/dL (ref 30.0–36.0)
MCV: 100.9 fl — ABNORMAL HIGH (ref 78.0–100.0)
Platelets: 233 10*3/uL (ref 150.0–400.0)
RBC: 4.56 Mil/uL (ref 3.87–5.11)
RDW: 12.8 % (ref 11.5–15.5)
WBC: 8.2 10*3/uL (ref 4.0–10.5)

## 2022-08-20 LAB — C-REACTIVE PROTEIN: CRP: <1 mg/dL (ref 0.5–20.0)

## 2022-08-22 ENCOUNTER — Other Ambulatory Visit (HOSPITAL_COMMUNITY): Payer: Self-pay

## 2022-08-23 ENCOUNTER — Ambulatory Visit
Admission: RE | Admit: 2022-08-23 | Discharge: 2022-08-23 | Disposition: A | Discharge status: Home / Self Care | Payer: Medicare Other | Source: Ambulatory Visit | Attending: Internal Medicine | Admitting: Internal Medicine

## 2022-08-23 DIAGNOSIS — Z1231 Encounter for screening mammogram for malignant neoplasm of breast: Secondary | ICD-10-CM

## 2022-08-27 ENCOUNTER — Ambulatory Visit (HOSPITAL_COMMUNITY): Payer: Medicare Other

## 2022-09-03 ENCOUNTER — Encounter: Payer: Self-pay | Admitting: Nurse Practitioner

## 2022-09-03 ENCOUNTER — Ambulatory Visit: Payer: Medicare Other | Admitting: Nurse Practitioner

## 2022-09-03 DIAGNOSIS — R1032 Left lower quadrant pain: Secondary | ICD-10-CM

## 2022-09-03 NOTE — Patient Instructions (Addendum)
1) Continue Miralax once daily to avoid constipation   2) Drink 6 to 8 glasses of water daily   3) Contact our office if your your lower abdominal pain recurs  It was a pleasure to see you today!  Thank you for trusting me with your gastrointestinal care!

## 2022-09-03 NOTE — Progress Notes (Signed)
Addendum: Reviewed and agree with assessment and management plan. Brenyn Petrey M, MD  

## 2022-09-03 NOTE — Progress Notes (Signed)
09/03/2022 Paige Lawrence [id number] [birth year]   Chief Complaint: Follow-up LLQ pain  History of Present Illness: Paige Lawrence. Floor is a 66 year old female with a past medical history of colon polyps and diverticulitis 06/2020 and 07/2021. She is known by Dr. Hilarie Fredrickson.   She developed LLQ pain on Sunday 08/11/2022 which was intense and lasted for a few hours then significantly diminished. For the next 4 to 5 days she continued to experience intermittent mild LLQ pain. She contacted our office on Friday 08/16/2022 with concerns she was having a diverticulitis flare and she did not want her symptoms worsen as the weekend approach.  She was prescribed Augmentin 875 mg 1 p.o. twice daily x 10 days with instructions to go to her lab for CBC and CMP with plans to schedule CTAP early the following week if her symptoms persisted.  She was also instructed go to the emergency room if she developed severe abdominal pain. Labs 08/20/2022 showed a WBC count of 8.2.  Hemoglobin 15.9.  CRP less than 1.  CMP was normal.  She contacted our office on Monday, 08/19/2022 and stated she did not take the Augmentin because her abdominal pain completely abated on Saturday 08/17/2022.  She ate a lot of Northern beans the week before she experience LLQ pain and she wondered if she ate too much fiber which triggered her to lower abdominal pain.  A few days after she ate the beans, she continued to pass normal formed stools but did not feel completely emptied for a few days.  She remains on MiraLAX once daily.  Her most recent colonoscopy was 11/16/2020 which identified severe diverticulosis to the mid and distal sigmoid colon and 2 polyps removed at the ileocecal valve and the ascending colon, path report showed 1 tubular adenoma and 1 lymphoid aggregate polyp.  She was advised to repeat a colonoscopy in 7 years.     Latest Ref Rng & Units 08/20/2022    1:11 PM 08/21/2020    9:48 AM 08/15/2020   12:50 PM  CBC  WBC 4.0 -  10.5 K/uL 8.2  6.9  8.7   Hemoglobin 12.0 - 15.0 g/dL 15.9  15.1  14.9   Hematocrit 36.0 - 46.0 % 46.0  44.6  44.5   Platelets 150.0 - 400.0 K/uL 233.0  386  418        Latest Ref Rng & Units 08/20/2022    1:11 PM 08/21/2020    9:48 AM 08/15/2020   12:50 PM  CMP  Glucose 70 - 99 mg/dL 90  87  82   BUN 6 - 23 mg/dL '10  5  4   '$ Creatinine 0.40 - 1.20 mg/dL 0.75  0.74  0.69   Sodium 135 - 145 mEq/L 137  140  141   Potassium 3.5 - 5.1 mEq/L 3.9  4.7  4.1   Chloride 96 - 112 mEq/L 101  102  102   CO2 19 - 32 mEq/L '28  27  25   '$ Calcium 8.4 - 10.5 mg/dL 9.8  9.9  9.2   Total Protein 6.0 - 8.3 g/dL 7.4  6.9    Total Bilirubin 0.2 - 1.2 mg/dL 0.9  0.5    Alkaline Phos 39 - 117 U/L 94     AST 0 - 37 U/L 19  13    ALT 0 - 35 U/L 13  6      Colonoscopy 11/16/2020: - One 3 mm polyp at the ileocecal valve, removed  with a cold biopsy forceps. Resected and retrieved. - One 3 mm polyp in the ascending colon, removed with a cold biopsy forceps. Resected and retrieved. retrieved. - Severe diverticulosis confined to the mid and distal sigmoid colon. There was narrowing of the colon in association with the diverticular opening but no discrete strictures. - Internal hemorrhoids - Recall colonoscopy 7 years Surgical [P], small bowel, ileocecal valve; colon, ascending, polyp (2) - TUBULAR ADENOMA, NEGATIVE FOR HIGH GRADE DYSPLASIA (X1). - COLONIC MUCOSA WITH UNDERLYING LYMPHOID AGGREGATE, NEGATIVE FOR DYSPLASIA (X1).  Current Outpatient Medications on File Prior to Visit  Medication Sig Dispense Refill   alendronate (FOSAMAX) 70 MG tablet Take 1 tablet (70 mg total) by mouth every 7 (seven) days. Take with a full glass of water on an empty stomach. 12 tablet 4   ALPRAZolam (XANAX) 0.5 MG tablet Take 1 tablet (0.5 mg total) by mouth 2 (two) times daily as needed for anxiety. 60 tablet 0   amoxicillin-clavulanate (AUGMENTIN) 875-125 MG tablet Take 1 tablet by mouth 2 (two) times daily. 20 tablet 0    ergocalciferol (DRISDOL) 1.25 MG (50000 UT) capsule Take 1 capsule (50,000 Units total) by mouth once a week. 4 capsule 2   meloxicam (MOBIC) 7.5 MG tablet Take 1 tablet (7.5 mg total) by mouth daily. 30 tablet 0   pantoprazole (PROTONIX) 40 MG tablet Take 1 tablet (40 mg total) by mouth daily. **PLEASE CALL OFFICE TO SCHEDULE FOLLOW UP 30 tablet 0   polyethylene glycol (MIRALAX / GLYCOLAX) 17 g packet Take 17 g by mouth daily.     No current facility-administered medications on file prior to visit.   Allergies  Allergen Reactions   Aspirin Other (See Comments)    Burning in stomach   Ibuprofen Other (See Comments)    Burning pressure on head   Ivp Dye [Iodinated Contrast Media] Hives   Sulfa Antibiotics Other (See Comments)    Leg weakness, burning pressure on head   Ciprofloxacin Hcl Rash   Current Medications, Allergies, Past Medical History, Past Surgical History, Family History and Social History were reviewed in [employer] record.  Review of Systems:   Constitutional: Negative for fever, sweats, chills or weight loss.  Respiratory: Negative for shortness of breath.   Cardiovascular: Negative for chest pain, palpitations and leg swelling.  Gastrointestinal: See HPI.  Musculoskeletal: Negative for back pain or muscle aches.  Neurological: Negative for dizziness, headaches or paresthesias.    Physical Exam: BP 122/62   Pulse 78   Ht '5\' 3"'$  (1.6 m)   Wt 105 lb (47.6 kg)   LMP 11/04/1996   BMI 18.60 kg/m   Wt Readings from Last 3 Encounters:  09/03/22 105 lb (47.6 kg)  06/25/22 105 lb 12.8 oz (48 kg)  01/14/22 107 lb (48.5 kg)    LMP 11/04/1996  General: 66 year old female in no acute distress. Head: Normocephalic and atraumatic. Eyes: No scleral icterus. Conjunctiva pink . Ears: Normal auditory acuity. Mouth: Dentition intact. No ulcers or lesions.  Lungs: Clear throughout to auscultation. Heart: Regular rate and rhythm, no murmur. Abdomen:  Soft, nontender and nondistended. No masses or hepatomegaly. Normal bowel sounds x 4 quadrants.  Rectal: Deferred.  Musculoskeletal: Symmetrical with no gross deformities. Extremities: No edema. Neurological: Alert oriented x 4. No focal deficits.  Psychological: Alert and cooperative. Normal mood and affect  Assessment and Recommendations:  80) 66 year old female with a past medical history of diverticulitis with recurrent LLQ pain 10/8 - 08/17/2022  which resolved without taking Augmentin.  Normal WBC count and CRP. -Continue MiraLAX nightly as tolerated to avoid constipation -Drink 6 to 8 glasses of water daily -Patient to contact office if LLQ abdominal pain recurs  2) History of one tubular adenomatous polyp per colonoscopy 11/2020.  Father with history of colon polyps.  Maternal aunt with history of colon cancer. -Next colonoscopy due 11/2027

## 2022-09-08 ENCOUNTER — Other Ambulatory Visit: Payer: Self-pay | Admitting: Nurse Practitioner

## 2022-09-09 ENCOUNTER — Other Ambulatory Visit: Payer: Self-pay

## 2022-09-09 MED ORDER — PANTOPRAZOLE SODIUM 40 MG PO TBEC: 40.0000 mg | DELAYED_RELEASE_TABLET | Freq: Every day | ORAL | 6 refills | Status: DC

## 2022-09-12 ENCOUNTER — Telehealth: Payer: Self-pay | Admitting: Internal Medicine

## 2022-09-12 NOTE — Telephone Encounter (Signed)
Kenny Stern [phone]  Mertice called because she had got a reminder call about labs on Monday and she was thinking she would not need to come because she had labs a GI on 08/20/2022, she had CBC (Abnormal) Hemoglobin 15.9, MCV 100.9, CMET, C-reactive protein. On 12/08/2020 with we done a Vit D. Then 08/21/2020 we done CPE labs Vit D, Lipid, CMET, all abnormal, TSH, CBC with diff normal

## 2022-09-16 ENCOUNTER — Other Ambulatory Visit: Payer: Medicare Other

## 2022-09-16 DIAGNOSIS — Z13220 Encounter for screening for lipoid disorders: Secondary | ICD-10-CM

## 2022-09-16 DIAGNOSIS — R5383 Other fatigue: Secondary | ICD-10-CM

## 2022-09-16 LAB — LIPID PANEL
Cholesterol: 177 mg/dL (ref <200)
HDL: 71 mg/dL (ref >=50)
LDL Cholesterol (Calc): 93 mg/dL (calc)
Non-HDL Cholesterol (Calc): 106 mg/dL (calc) (ref <130)
Total CHOL/HDL Ratio: 2.5 (calc) (ref <5.0)
Triglycerides: 51 mg/dL (ref <150)

## 2022-09-16 LAB — TSH: TSH: 2.77 mIU/L (ref 0.40–4.50)

## 2022-09-23 ENCOUNTER — Encounter: Payer: Self-pay | Admitting: Internal Medicine

## 2022-09-23 ENCOUNTER — Ambulatory Visit (INDEPENDENT_AMBULATORY_CARE_PROVIDER_SITE_OTHER): Payer: Medicare Other | Admitting: Internal Medicine

## 2022-09-23 VITALS — BP 110/62 | HR 81 | Temp 98.0°F | Ht 63.0 in | Wt 104.8 lb

## 2022-09-23 DIAGNOSIS — M810 Age-related osteoporosis without current pathological fracture: Secondary | ICD-10-CM

## 2022-09-23 DIAGNOSIS — Z0000 Encounter for general adult medical examination without abnormal findings: Secondary | ICD-10-CM | POA: Diagnosis not present

## 2022-09-23 DIAGNOSIS — Z8659 Personal history of other mental and behavioral disorders: Secondary | ICD-10-CM

## 2022-09-23 DIAGNOSIS — R82998 Other abnormal findings in urine: Secondary | ICD-10-CM | POA: Diagnosis not present

## 2022-09-23 DIAGNOSIS — Z87891 Personal history of nicotine dependence: Secondary | ICD-10-CM

## 2022-09-23 DIAGNOSIS — Z8719 Personal history of other diseases of the digestive system: Secondary | ICD-10-CM

## 2022-09-23 DIAGNOSIS — N398 Other specified disorders of urinary system: Secondary | ICD-10-CM

## 2022-09-23 LAB — POCT URINALYSIS DIPSTICK
Bilirubin, UA: NEGATIVE
Blood, UA: NEGATIVE
Glucose, UA: NEGATIVE
Ketones, UA: NEGATIVE
Nitrite, UA: NEGATIVE
Protein, UA: NEGATIVE
Spec Grav, UA: 1.01 (ref 1.010–1.025)
Urobilinogen, UA: 0.2 E.U./dL
pH, UA: 6.5 (ref 5.0–8.0)

## 2022-09-23 NOTE — Progress Notes (Signed)
Annual Wellness Visit     Patient: Paige Lawrence, Female    DOB: 12-Apr-1956, 66 y.o.   MRN: [id number] Visit Date: 09/23/2022   Subjective    Paige Lawrence is a 66 y.o. female who presents today for her Annual Wellness Visit.  HPI She also presents for health maintenance exam and evaluation of medical issues. She has a history of voiding dysfunction that started with hospitalization for diverticulutis. Is followed by Dr. Abner Greenspan at [facility] and does self cath about 4 times a day.  Urine specimen today is abnormal and culture will be sent.  Specimen has 3+ LE.  Patient had GYN appointment in February 2023 with Nurse practitioner.  Also saw Dr. Dellis Filbert for osteoporosis in March 2023.  T-score was -3.1 at that time.  Patient was seen by nurse practitioner at [place] in  October with left lower quadrant pain.  Patient was concerned about diverticulitis.  White count was normal and she was prescribed Augmentin.  She has been prescribed MiraLAX in the past.  Had colonoscopy in 2022 and was identified as having severe diverticulosis in the mid and distal sigmoid colon.  2 polyps removed one was a tubular adenoma and one was a lymphoid aggregate polyp.  Repeat colonoscopy advised in 7 years.  Patient was admitted to the hospital at [facility] in October 2021 with a small bowel obstruction.  Past medical history: History of anxiety disorder.  History of fractured clavicle in the remote past.  C-section 1980.  Intolerant of sulfa and ibuprofen.  Patient had colonoscopy at [facility] in 2013 showing 4 hyperplastic polyps.  On June 14, 2020 she presented to [facility] emergency department with abdominal pain.  Had been seen previously by nurse practitioner at GYN office the previous day.  Urine dipstick was unremarkable and nurse practitioner felt patient had diverticulitis.  White count was 13,600.  She has small LE on urine dipstick.  CT of the abdomen and  pelvis showed highly inflamed mid sigmoid colon.  Was felt to have acute diverticulitis without perforation or abscess.  Has severely distended urinary bladder with mild wall thickening.  Was felt to have urinary retention secondary to diverticulitis.  Also was found to have cholelithiasis and aortic atherosclerosis.  Patient was treated with Zofran, Norco for pain, Cipro and Flagyl and was discharged home.  On August 13, she called GYN office indicating metronidazole was not agreeing with her and she noticed itching on back and chest.  GYN office felt she was allergic to Cipro and recommended discontinuing Cipro and continuing metronidazole.  Was seen back in the emergency department at [facility] on August 14 and rash is spread to abdomen, chest and legs.  She took Xyzal but continued to have rash and itching.  It was felt she had an allergic reaction to Cipro.  She was placed on Augmentin instead.  She subsequently went to [facility] in [place] July 30, 2020 with complaint of cystitis and had white blood cell count of 15,300.  Urine specimen was positive for LE but negative for nitrite.  Ultrasound of the gallbladder showed a 6 mm gallstone.  Had no evidence of diverticulitis on CT.  It was felt that she has allergy to IV contrast media.  She was confirmed to have a UTI during this hospitalization.  She had urinary retention.  Was treated with IV Rocephin.  She was unwilling to do In-N-Out cath as an outpatient and was  sent home only Foley catheter and subsequently referred to urology.  She continues to do regular IandO cath about 4 times a day.  Underwent pelvic floor physical therapy but remains on intermittent catheterization.  Was felt to have a UTI and was treated with cephalexin twice daily for 7 days.    Family history: Father with history of dementia.  Social history: Patient continues to smoke.  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    Patient Care Team: Elby Showers, MD as PCP - General (Internal Medicine)  Review of Systems-see above: Denies chest pain, shortness of breath, abdominal pain, constipation, joint issues, depression   Objective    Vitals: BP 110/62   Pulse 81   Temp 98 F (36.7 C) (Tympanic)   Ht '5\' 3"'$  (1.6 m)   Wt 104 lb 12.8 oz (47.5 kg)   LMP 11/04/1996   SpO2 98%   BMI 18.56 kg/m   Physical Exam  Skin: Warm and dry.  Nodes none.  TMs clear.  Neck supple without JVD, thyromegaly or carotid bruits.  Chest clear.  Cardiac exam: Regular rate and rhythm without ectopy.  Abdomen soft nondistended without hepatosplenomegaly masses or tenderness.  Breast and GYN exam deferred to GYN physician.  No lower extremity pitting edema.  Brief neurological exam is intact without gross focal deficits.   Most recent functional status assessment:    09/23/2022    2:19 PM  In your present state of health, do you have any difficulty performing the following activities:  Hearing? 0  Vision? 0  Difficulty concentrating or making decisions? 0  Walking or climbing stairs? 0  Dressing or bathing? 0  Doing errands, shopping? 0  Preparing Food and eating ? N  Using the Toilet? N  In the past six months, have you accidently leaked urine? N  Do you have problems with loss of bowel control? N  Managing your Medications? N  Managing your Finances? N  Housekeeping or managing your Housekeeping? N   Most recent fall risk assessment:    09/23/2022    2:18 PM  [place] in the past year? 0  Number falls in past yr: 0  Injury with Fall? 0  Risk for fall due to : No Fall Risks  Follow up Falls evaluation completed    Most recent depression screenings:    09/23/2022    2:18 PM 06/25/2022    4:31 PM  PHQ 2/9 Scores  PHQ - 2 Score 0 0   Most recent cognitive screening:    09/23/2022    2:20 PM  6CIT Screen  What Year? 0 points  What month? 0 points  What time? 0 points  Count  back from 20 0 points  Months in reverse 0 points  Repeat phrase 0 points  Total Score 0 points       Assessment & Plan   History of diverticulitis and small bowel obstruction in October 2021  History of urinary retention at that time as well and was treated with I&O self catheterizations which she continues to do about 4 times daily.  She is followed at [facility] urology by Dr. Abner Greenspan  History of smoking and continues to smoke  History of anxiety  Currently has leukocytes in urine and culture will be sent  History of osteoporosis treated by Dr. Dellis Filbert  Plan: Urine culture sent to rule out infection.  Addendum: Has E. coli UTI and will be treated.  Other labs are within normal limits.  Has elevated hemoglobin due to history of smoking.  Recommend follow-up after treatment for UTI.  Will be treated with Augmentin twice daily for 7 days.  She reports that she has follow-up with Dr. Abner Greenspan in about 2 weeks at [facility].       Annual wellness visit done today including the all of the following: Reviewed patient's Family Medical History Reviewed and updated list of patient's medical providers Assessment of cognitive impairment was done Assessed patient's functional ability Established a written schedule for health screening [place] Completed and Reviewed  Discussed health benefits of physical activity, and encouraged her to engage in regular exercise appropriate for her age and condition.    IElby Showers, MD, have reviewed all documentation for this visit. The documentation on 09/23/22 for the exam, diagnosis, procedures, and orders are all accurate and complete.    IElby Showers, MD, have reviewed all documentation for this visit. The documentation on 09/29/22 for the exam, diagnosis, procedures, and orders are all accurate and complete.  LaVon Barron Alvine, CMA

## 2022-09-26 LAB — URINE CULTURE
MICRO NUMBER:: 14214140
SPECIMEN QUALITY:: ADEQUATE

## 2022-09-28 ENCOUNTER — Telehealth: Payer: Self-pay | Admitting: Internal Medicine

## 2022-09-28 ENCOUNTER — Encounter: Payer: Self-pay | Admitting: Internal Medicine

## 2022-09-28 MED ORDER — AMOXICILLIN-POT CLAVULANATE 500-125 MG PO TABS: 1.0000 | ORAL_TABLET | Freq: Two times a day (BID) | ORAL | 0 refills | Status: DC

## 2022-09-28 NOTE — Telephone Encounter (Signed)
Patient contacted. Her urine cilture grew E,coli. Sending in Augmentin twice daily for 7 days. She has follow up with Dr. Abner Greenspan in about 2 weeks at [facility].

## 2022-09-29 NOTE — Patient Instructions (Addendum)
Patient will be treated with Augmentin for E. coli UTI based on recent culture and will follow-up with Dr. Abner Greenspan in about 2 weeks at [facility] u[facility]

## 2022-10-13 ENCOUNTER — Other Ambulatory Visit: Payer: Self-pay | Admitting: Internal Medicine

## 2022-12-27 ENCOUNTER — Telehealth: Payer: Self-pay | Admitting: Nurse Practitioner

## 2022-12-27 NOTE — Telephone Encounter (Signed)
Pt stated that she only took sips of chicken broth and she feels super distended and is worried.  Pt has history of bowel obstruction in the past whereas pt stated then that she did not have any abdominal pain nor nausea at that time either and was admitted and given an NG tube. Pt explained with her history and the current symptoms my recommendations would be  to proceed to the ED for evaluation and treatment. Pt was notified that I would notify Carl Best NP and see if she had other recommendations. Please advise.

## 2022-12-27 NOTE — Telephone Encounter (Signed)
Pt notified to Paige Best NP recommendations: Pt stated that if her friend takes her she will go to the ED at [facility] and if her son takes her she will go to the ED at [facility]

## 2022-12-27 NOTE — Telephone Encounter (Signed)
Remo Lipps, I agree with your recommendations for ED evaluation to rule out an impending obstruction. Pls verify which ED she will be going to so I can alert or GI service in case they get called for a GI consult.

## 2022-12-27 NOTE — Telephone Encounter (Signed)
Pt  stated that she ate a lot of chips and salsa on Tuesday night which upset her stomach and she had multiple BM's throughout the night. Pt stated that since then she has been feeling bloated after she eats . No BM on Wednesday. Pt did have a BM on Thursday.Pt stated that she did have a history of SBO and was worried about that. Pt was encouraged to stay on a clear liquid diet for a couple days, drink lots of water, walk frequently. Pt notified to give our office a call back on Monday morning if not better.  Pt verbalized understanding with all questions answered.

## 2022-12-27 NOTE — Telephone Encounter (Signed)
Inbound call from patient stating that soon after she eats she begins to get bloated, and feels full to the point where she feels like she is going to pop. Patient is requesting a call back to discuss and to see what suggestions we have before the weekend. Please advise.

## 2022-12-27 NOTE — Telephone Encounter (Signed)
Patient is calling states she has one more question, says she barely drank any broth and was still super bloated and is not sure if she should be alarmed or not. Please advise

## 2022-12-29 NOTE — Telephone Encounter (Signed)
Remo Lipps, pls contact patient Monday 12/30/2022 and obtain an update as she did not go to [facility] ED, most likely went to ED in [place] as you noted below. THX

## 2022-12-30 NOTE — Telephone Encounter (Signed)
Noted  

## 2022-12-30 NOTE — Telephone Encounter (Signed)
Pt stated that she did go to the ED on Friday and notified her that she did not have an obstruction. Pt stated that her symptoms resolved over the weekend. Pt stated that she is doing fine.  Last BM this AM.

## 2023-02-10 ENCOUNTER — Encounter: Payer: Self-pay | Admitting: Internal Medicine

## 2023-02-10 ENCOUNTER — Ambulatory Visit (INDEPENDENT_AMBULATORY_CARE_PROVIDER_SITE_OTHER): Payer: Medicare Other | Admitting: Internal Medicine

## 2023-02-10 ENCOUNTER — Telehealth: Payer: Self-pay | Admitting: Internal Medicine

## 2023-02-10 VITALS — BP 130/70 | HR 88 | Temp 98.4°F | Ht 63.0 in | Wt 95.1 lb

## 2023-02-10 DIAGNOSIS — S30860A Insect bite (nonvenomous) of lower back and pelvis, initial encounter: Secondary | ICD-10-CM

## 2023-02-10 DIAGNOSIS — Z8659 Personal history of other mental and behavioral disorders: Secondary | ICD-10-CM

## 2023-02-10 DIAGNOSIS — W57XXXA Bitten or stung by nonvenomous insect and other nonvenomous arthropods, initial encounter: Secondary | ICD-10-CM

## 2023-02-10 DIAGNOSIS — M810 Age-related osteoporosis without current pathological fracture: Secondary | ICD-10-CM | POA: Diagnosis not present

## 2023-02-10 DIAGNOSIS — F411 Generalized anxiety disorder: Secondary | ICD-10-CM | POA: Diagnosis not present

## 2023-02-10 DIAGNOSIS — S70361A Insect bite (nonvenomous), right thigh, initial encounter: Secondary | ICD-10-CM

## 2023-02-10 DIAGNOSIS — Z87891 Personal history of nicotine dependence: Secondary | ICD-10-CM

## 2023-02-10 MED ORDER — DOXYCYCLINE HYCLATE 100 MG PO TABS: 100.0000 mg | ORAL_TABLET | Freq: Two times a day (BID) | ORAL | 0 refills | Status: DC

## 2023-02-10 MED ORDER — TRIAMCINOLONE ACETONIDE 0.1 % EX CREA: TOPICAL_CREAM | CUTANEOUS | 0 refills | Status: DC

## 2023-02-10 NOTE — Telephone Encounter (Signed)
Paige Lawrence [phone]  [name] call to say she has a tick bite from Saturday and she also a 2 places from Tuesday that has dark places in the center of them, she first thought they might be spider bites but now she is afraid the might be tick bites with the ticks still in them, so I went ahead and scheduled her to come in for you to look at spots.

## 2023-02-10 NOTE — Progress Notes (Signed)
Patient Care Team: Margaree Mackintosh, MD as PCP - General (Internal Medicine)  Visit Date: 02/10/23  Subjective:    Patient ID: Paige Lawrence , Female   DOB: [birth year], 67 y.o.    MRN: [id number]   67 y.o. Female presents today for tick bites. Due for tetanus vaccine. Has been having intermittent headaches, no other symptoms.  Past Medical History:  Diagnosis Date   Aortic atherosclerosis    Cholelithiasis    Colon polyps    DDD (degenerative disc disease), lumbar    Diverticulosis    GERD (gastroesophageal reflux disease)    Hemorrhoids    Herniated disc, cervical    c6-7   Hiatal hernia    Hx of spinal stenosis    Hypoglycemic reaction    Internal hemorrhoids    Mitral valve prolapse    Osteopenia    -1.7 2/05; -1.4 3/07; -2.2 7/11   Uterine fibroid    Vitamin D deficiency    26 4/10; 28 5/11     Family History  Problem Relation Age of Onset   Cancer Maternal Aunt        colon   Colon cancer Maternal Aunt    Hypertension Mother    Colon polyps Father    Heart disease Brother    Diabetes Paternal Aunt    Breast cancer Cousin        maternal cousin   Cancer Son        Appendix    Social History   Social History Narrative   Not on file      Review of Systems  Constitutional:  Negative for fever and malaise/fatigue.  HENT:  Negative for congestion.   Eyes:  Negative for blurred vision.  Respiratory:  Negative for cough and shortness of breath.   Cardiovascular:  Negative for chest pain, palpitations and leg swelling.  Gastrointestinal:  Negative for vomiting.  Musculoskeletal:  Negative for back pain.  Skin:  Negative for rash.  Neurological:  Positive for headaches (Intermittent). Negative for loss of consciousness.        Objective:   Vitals: BP 130/70   Pulse 88   Temp 98.4 F (36.9 C) (Tympanic)   Ht 5\' 3"  (1.6 m)   Wt 95 lb 1.9 oz (43.1 kg)   LMP 11/04/1996   SpO2 98%   BMI 16.85 kg/m    Physical Exam Vitals and nursing  note reviewed.  Constitutional:      General: She is not in acute distress.    Appearance: Normal appearance. She is not toxic-appearing.  HENT:     Head: Normocephalic and atraumatic.  Pulmonary:     Effort: Pulmonary effort is normal.  Skin:    General: Skin is warm and dry.     Findings: Erythema and lesion present.     Comments: Two erythematous lesions with central punctum on right anterior thigh, right buttock 16mm diameter.  Neurological:     Mental Status: She is alert and oriented to person, place, and time. Mental status is at baseline.  Psychiatric:        Mood and Affect: Mood normal.        Behavior: Behavior normal.        Thought Content: Thought content normal.        Judgment: Judgment normal.       Results:   Studies obtained and personally reviewed by me:   Labs:       Component Value Date/Time  NA 137 08/20/2022 1311   K 3.9 08/20/2022 1311   CL 101 08/20/2022 1311   CO2 28 08/20/2022 1311   GLUCOSE 90 08/20/2022 1311   BUN 10 08/20/2022 1311   CREATININE 0.75 08/20/2022 1311   CREATININE 0.74 08/21/2020 0948   CALCIUM 9.8 08/20/2022 1311   PROT 7.4 08/20/2022 1311   ALBUMIN 4.4 08/20/2022 1311   AST 19 08/20/2022 1311   ALT 13 08/20/2022 1311   ALKPHOS 94 08/20/2022 1311   BILITOT 0.9 08/20/2022 1311   GFRNONAA 86 08/21/2020 0948   GFRAA 100 08/21/2020 0948     Lab Results  Component Value Date   WBC 8.2 08/20/2022   HGB 15.9 (H) 08/20/2022   HCT 46.0 08/20/2022   MCV 100.9 (H) 08/20/2022   PLT 233.0 08/20/2022    Lab Results  Component Value Date   CHOL 177 09/16/2022   HDL 71 09/16/2022   LDLCALC 93 09/16/2022   TRIG 51 09/16/2022   CHOLHDL 2.5 09/16/2022    No results found for: "HGBA1C"   Lab Results  Component Value Date   TSH 2.77 09/16/2022      Assessment & Plan:   Tick bites: Triamcinolone cream 0.1% apply to bite areas three times daily for 5-7 days, doxycycline 100 mg twice daily for 7 days. Will go to  pharmacy for tetanus vaccine and call with date.  Anxiety state-treated with Xanax  History of smoking  GE reflux treated with PPI  Osteoporosis seen by GYN physician and treated  I,Alexander Ruley,acting as a scribe for Margaree MackintoshMary J Aastha Dayley, MD.,have documented all relevant documentation on the behalf of Margaree MackintoshMary J Aerielle Stoklosa, MD,as directed by  Margaree MackintoshMary J Kimberly Nieland, MD while in the presence of Margaree MackintoshMary J Remo Kirschenmann, MD.   I, Margaree MackintoshMary J Madlyn Crosby, MD, have reviewed all documentation for this visit. The documentation on 02/22/23 for the exam, diagnosis, procedures, and orders are all accurate and complete.

## 2023-02-22 DIAGNOSIS — Z8659 Personal history of other mental and behavioral disorders: Secondary | ICD-10-CM | POA: Insufficient documentation

## 2023-02-22 DIAGNOSIS — M810 Age-related osteoporosis without current pathological fracture: Secondary | ICD-10-CM | POA: Insufficient documentation

## 2023-02-22 NOTE — Patient Instructions (Signed)
You have been treated today for 2 tick bites with doxycycline 100 mg twice daily for 7 days and triamcinolone cream topically 3 times daily for 5 to 7 days.  Please go to pharmacy and get tetanus immunization and call with date of that vaccine.  May continue with Xanax for anxiety.  Continue with osteoporosis treatment per GYN physician.

## 2023-03-12 ENCOUNTER — Telehealth: Payer: Self-pay | Admitting: Internal Medicine

## 2023-03-12 NOTE — Telephone Encounter (Signed)
Paige Lawrence [phone]  [name] called to say she is having some spasms in her chest area under her breast it starts on the left side and moves to the right. It is worse when she is sitting , gets little better when standing. Does not bother her when she is laying down. Wants to come in and see you to try and figure out what is going on, this has been going on for several weeks now. Occasionally ishe has them in her back.

## 2023-03-13 NOTE — Telephone Encounter (Signed)
scheduled

## 2023-03-14 ENCOUNTER — Encounter: Payer: Self-pay | Admitting: Internal Medicine

## 2023-03-14 ENCOUNTER — Ambulatory Visit (INDEPENDENT_AMBULATORY_CARE_PROVIDER_SITE_OTHER): Payer: Medicare Other | Admitting: Internal Medicine

## 2023-03-14 ENCOUNTER — Other Ambulatory Visit: Payer: Medicare Other

## 2023-03-14 VITALS — BP 136/84 | HR 76 | Temp 98.5°F | Ht 63.0 in | Wt 97.0 lb

## 2023-03-14 DIAGNOSIS — R718 Other abnormality of red blood cells: Secondary | ICD-10-CM

## 2023-03-14 DIAGNOSIS — M8580 Other specified disorders of bone density and structure, unspecified site: Secondary | ICD-10-CM

## 2023-03-14 DIAGNOSIS — R079 Chest pain, unspecified: Secondary | ICD-10-CM | POA: Diagnosis not present

## 2023-03-14 DIAGNOSIS — Z87891 Personal history of nicotine dependence: Secondary | ICD-10-CM | POA: Diagnosis not present

## 2023-03-14 DIAGNOSIS — Z8659 Personal history of other mental and behavioral disorders: Secondary | ICD-10-CM

## 2023-03-14 DIAGNOSIS — K219 Gastro-esophageal reflux disease without esophagitis: Secondary | ICD-10-CM

## 2023-03-14 NOTE — Progress Notes (Signed)
Patient Care Team: Margaree Mackintosh, MD as PCP - General (Internal Medicine)  Visit Date: 03/14/23  Subjective:    Patient ID: Paige Lawrence , Female   DOB: [birth year], 67 y.o.    MRN: [id number]   67 y.o. Female presents today for chest pain starting below left breast and moving to the right since April.  Was 104 lb 12.8 oz in November. Now weighs 97 lbs. No SOB or hemoptysis.  Past Medical History:  Diagnosis Date   Aortic atherosclerosis (HCC)    Cholelithiasis    Colon polyps    DDD (degenerative disc disease), lumbar    Diverticulosis    GERD (gastroesophageal reflux disease)    Hemorrhoids    Herniated disc, cervical    c6-7   Hiatal hernia    Hx of spinal stenosis    Hypoglycemic reaction    Internal hemorrhoids    Mitral valve prolapse    Osteopenia    -1.7 2/05; -1.4 3/07; -2.2 7/11   Uterine fibroid    Vitamin D deficiency    26 4/10; 28 5/11     Family History  Problem Relation Age of Onset   Cancer Maternal Aunt        colon   Colon cancer Maternal Aunt    Hypertension Mother    Colon polyps Father    Heart disease Brother    Diabetes Paternal Aunt    Breast cancer Cousin        maternal cousin   Cancer Son        Appendix         ROS no hemoptysis no substernal chest pain      Objective:   Vitals: LMP 11/04/1996    Physical Exam    Results:   Studies obtained and personally reviewed by me:  Imaging, colonoscopy, mammogram, bone density scan, echocardiogram, heart cath, stress test, CT calcium score, etc.   Labs:       Component Value Date/Time   NA 137 08/20/2022 1311   K 3.9 08/20/2022 1311   CL 101 08/20/2022 1311   CO2 28 08/20/2022 1311   GLUCOSE 90 08/20/2022 1311   BUN 10 08/20/2022 1311   CREATININE 0.75 08/20/2022 1311   CREATININE 0.74 08/21/2020 0948   CALCIUM 9.8 08/20/2022 1311   PROT 7.4 08/20/2022 1311   ALBUMIN 4.4 08/20/2022 1311   AST 19 08/20/2022 1311   ALT 13 08/20/2022 1311   ALKPHOS 94  08/20/2022 1311   BILITOT 0.9 08/20/2022 1311   GFRNONAA 86 08/21/2020 0948   GFRAA 100 08/21/2020 0948     Lab Results  Component Value Date   WBC 8.2 08/20/2022   HGB 15.9 (H) 08/20/2022   HCT 46.0 08/20/2022   MCV 100.9 (H) 08/20/2022   PLT 233.0 08/20/2022    Lab Results  Component Value Date   CHOL 177 09/16/2022   HDL 71 09/16/2022   LDLCALC 93 09/16/2022   TRIG 51 09/16/2022   CHOLHDL 2.5 09/16/2022    No results found for: "HGBA1C"   Lab Results  Component Value Date   TSH 2.77 09/16/2022     No results found for: "PSA1", "PSA"  delete for female pts      Assessment & Plan:   Chest pain- likely musculoskeletal but cannot rule out CAD with hx of smoking    I,Alexander Ruley,acting as a scribe for Margaree Mackintosh, MD.,have documented all relevant documentation on the behalf of Margaree Mackintosh,  MD,as directed by  Margaree Mackintosh, MD while in the presence of Margaree Mackintosh, MD.   I, Margaree Mackintosh, MD, have reviewed all documentation for this visit. The documentation on 03/14/23 for the exam, diagnosis, procedures, and orders are all accurate and complete.

## 2023-03-14 NOTE — Progress Notes (Signed)
Patient Care Team: Margaree Mackintosh, MD as PCP - General (Internal Medicine)  Visit Date: 03/14/23  Subjective:    Patient ID: Paige Lawrence , Female   DOB: [birth year], 67 y.o.    MRN: [id number]   67 y.o. Female presents today for chest pain starting below left breast and moving to the right chest intermittently since April. Was 104 lb 12.8 oz in November. Now weighs 97 lbs. No SOB or hemoptysis.  She is not sure why she has lost weight but has little appetite.  She is a smoker.  Past Medical History:  Diagnosis Date   Aortic atherosclerosis (HCC)    Cholelithiasis    Colon polyps    DDD (degenerative disc disease), lumbar    Diverticulosis    GERD (gastroesophageal reflux disease)    Hemorrhoids    Herniated disc, cervical    c6-7   Hiatal hernia    Hx of spinal stenosis    Hypoglycemic reaction    Internal hemorrhoids    Mitral valve prolapse    Osteopenia    -1.7 2/05; -1.4 3/07; -2.2 7/11   Uterine fibroid    Vitamin D deficiency    26 4/10; 28 5/11     Family History  Problem Relation Age of Onset   Cancer Maternal Aunt        colon   Colon cancer Maternal Aunt    Hypertension Mother    Colon polyps Father    Heart disease Brother    Diabetes Paternal Aunt    Breast cancer Cousin        maternal cousin   Cancer Son        Appendix    History of osteoporosis treated with Fosamax by GYN Dr. Seymour Bars  History of diverticulitis 2021 seen by Gastroenterology, Dr. Rhea Belton.  Had recurrence in September 2022.   ROS no hemoptysis no substernal chest pain  GE reflux treated with PPI  History of smoking  History of spinal stenosis  Herniated cervical disc at C6-C7  History of mitral valve prolapse  History of small bowel obstruction hospitalized October 1 through August 06, 2020 at [facility].  Developed urinary retention and had to wear a Foley catheter for a period of time.    Objective:   Vitals: BP 136/84   Pulse 76   Temp  98.5 F (36.9 C) (Tympanic)   Ht 5\' 3"  (1.6 m)   Wt 97 lb (44 kg)   LMP 11/04/1996   SpO2 98%   BMI 17.18 kg/m    Physical Exam Vitals and nursing note reviewed.  Constitutional:      General: She is not in acute distress.    Appearance: Normal appearance. She is not toxic-appearing.  HENT:     Head: Normocephalic and atraumatic.  Cardiovascular:     Rate and Rhythm: Normal rate and regular rhythm. No extrasystoles are present.    Pulses: Normal pulses.     Heart sounds: Normal heart sounds. No murmur heard.    No friction rub. No gallop.  Pulmonary:     Effort: Pulmonary effort is normal. No respiratory distress.     Breath sounds: Normal breath sounds. No wheezing or rales.  Chest:     Comments: Mild tenderness on sternum. Abdominal:     General: Abdomen is flat. There is no distension.     Palpations: Abdomen is soft.     Tenderness: There is no abdominal tenderness.  Skin:  General: Skin is warm and dry.  Neurological:     Mental Status: She is alert and oriented to person, place, and time. Mental status is at baseline.  Psychiatric:        Mood and Affect: Mood normal.        Behavior: Behavior normal.        Thought Content: Thought content normal.        Judgment: Judgment normal.       Results:   Studies obtained and personally reviewed by me:  EKG showed sinus rhythm with occasional ectopic ventricular beat, combined atrial enlargement.  Labs:       Component Value Date/Time   NA 137 08/20/2022 1311   K 3.9 08/20/2022 1311   CL 101 08/20/2022 1311   CO2 28 08/20/2022 1311   GLUCOSE 90 08/20/2022 1311   BUN 10 08/20/2022 1311   CREATININE 0.75 08/20/2022 1311   CREATININE 0.74 08/21/2020 0948   CALCIUM 9.8 08/20/2022 1311   PROT 7.4 08/20/2022 1311   ALBUMIN 4.4 08/20/2022 1311   AST 19 08/20/2022 1311   ALT 13 08/20/2022 1311   ALKPHOS 94 08/20/2022 1311   BILITOT 0.9 08/20/2022 1311   GFRNONAA 86 08/21/2020 0948   GFRAA 100 08/21/2020  0948     Lab Results  Component Value Date   WBC 8.2 08/20/2022   HGB 15.9 (H) 08/20/2022   HCT 46.0 08/20/2022   MCV 100.9 (H) 08/20/2022   PLT 233.0 08/20/2022    Lab Results  Component Value Date   CHOL 177 09/16/2022   HDL 71 09/16/2022   LDLCALC 93 09/16/2022   TRIG 51 09/16/2022   CHOLHDL 2.5 09/16/2022    No results found for: "HGBA1C"   Lab Results  Component Value Date   TSH 2.77 09/16/2022      Assessment & Plan:   Chest pain: ordered CT coronary calcium score. Ordered CBC with Diff/Plat, CMET, TSH. EKG showed sinus rhythm with occasional ectopic ventricular beat, combined atrial enlargement, anterior infarct. Will refer to Cardiology.  Patient is worried about heart disease.  She has a history of smoking.  Heart disease in brother.  Hypertension in mother.  However I feel that she has chest wall pain today and may treat this with anti-inflammatory medication.  Labs drawn today include CBC with differential, c-Met and TSH  Anxiety: stable without medications.  Previously took Xanax.  Osteopenia treated with Fosamax  GE reflux treated with Protonix  Vitamin D deficiency treated with Drisdol in the remote past  History of smoking-needs to quit  Plan: Ordered CT coronary calcium scoring which will give [place] information about her lung feels that she is a smoker in addition to a coronary calcium score.  Then, I would like for her to see cardiologist for evaluation.  Has been counseled numerous times about smoking cessation.  I,Alexander Ruley,acting as a Neurosurgeon for Margaree Mackintosh, MD.,have documented all relevant documentation on the behalf of Margaree Mackintosh, MD,as directed by  Margaree Mackintosh, MD while in the presence of Margaree Mackintosh, MD. Addendum: MCV is 101.4-we will see if B12 and folate can be added to labs due to elevated MCV  I, Margaree Mackintosh, MD, have reviewed all documentation for this visit. The documentation on 03/14/23 for the exam, diagnosis, procedures,  and orders are all accurate and complete.

## 2023-03-14 NOTE — Progress Notes (Signed)
Labs ordered.

## 2023-03-14 NOTE — Patient Instructions (Addendum)
Suspect chest wall pain but is smoker and has family hx of heart disease. She will have coronary calcium scoring with chest CT soon. If develops prolonged chest pain and or SOB is to go to ED immediately. Labs drawn today.  See if B12 and folate can be added to labs due to elevated MCV.  Smoking cessation recommended.  May need Cardiology evaluation.

## 2023-03-15 LAB — COMPLETE METABOLIC PANEL WITH GFR
AG Ratio: 1.6 (calc) (ref 1.0–2.5)
ALT: 19 U/L (ref 6–29)
AST: 27 U/L (ref 10–35)
Albumin: 4.9 g/dL (ref 3.6–5.1)
Alkaline phosphatase (APISO): 103 U/L (ref 37–153)
BUN: 9 mg/dL (ref 7–25)
CO2: 27 mmol/L (ref 20–32)
Calcium: 10.7 mg/dL — ABNORMAL HIGH (ref 8.6–10.4)
Chloride: 104 mmol/L (ref 98–110)
Creat: 0.75 mg/dL (ref 0.50–1.05)
Globulin: 3.1 g/dL (calc) (ref 1.9–3.7)
Glucose, Bld: 76 mg/dL (ref 65–99)
Potassium: 4.9 mmol/L (ref 3.5–5.3)
Sodium: 143 mmol/L (ref 135–146)
Total Bilirubin: 1 mg/dL (ref 0.2–1.2)
Total Protein: 8 g/dL (ref 6.1–8.1)
eGFR: 88 mL/min/{1.73_m2} (ref >=60)

## 2023-03-15 LAB — CBC WITH DIFFERENTIAL/PLATELET
Absolute Monocytes: 491 cells/uL (ref 200–950)
Basophils Absolute: 47 cells/uL (ref 0–200)
Basophils Relative: 0.6 %
Eosinophils Absolute: 47 cells/uL (ref 15–500)
Eosinophils Relative: 0.6 %
HCT: 51.4 % — ABNORMAL HIGH (ref 35.0–45.0)
Hemoglobin: 17.8 g/dL — ABNORMAL HIGH (ref 11.7–15.5)
Lymphs Abs: 1381 cells/uL (ref 850–3900)
MCH: 35.1 pg — ABNORMAL HIGH (ref 27.0–33.0)
MCHC: 34.6 g/dL (ref 32.0–36.0)
MCV: 101.4 fL — ABNORMAL HIGH (ref 80.0–100.0)
MPV: 10.7 fL (ref 7.5–12.5)
Monocytes Relative: 6.3 %
Neutro Abs: 5834 cells/uL (ref 1500–7800)
Neutrophils Relative %: 74.8 %
Platelets: 253 10*3/uL (ref 140–400)
RBC: 5.07 10*6/uL (ref 3.80–5.10)
RDW: 12.1 % (ref 11.0–15.0)
Total Lymphocyte: 17.7 %
WBC: 7.8 10*3/uL (ref 3.8–10.8)

## 2023-03-15 LAB — TSH: TSH: 2.29 mIU/L (ref 0.40–4.50)

## 2023-03-20 NOTE — Addendum Note (Signed)
Addended by: Mary Sella D on: 03/20/2023 01:19 PM   Modules accepted: Orders

## 2023-03-24 ENCOUNTER — Other Ambulatory Visit: Payer: Medicare Other

## 2023-03-24 DIAGNOSIS — R718 Other abnormality of red blood cells: Secondary | ICD-10-CM

## 2023-03-25 LAB — B12 AND FOLATE PANEL
Folate: 11.2 ng/mL
Vitamin B-12: 347 pg/mL (ref 200–1100)

## 2023-04-03 ENCOUNTER — Ambulatory Visit (HOSPITAL_COMMUNITY)
Admission: RE | Admit: 2023-04-03 | Discharge: 2023-04-03 | Disposition: A | Discharge status: Home / Self Care | Payer: Medicare Other | Source: Ambulatory Visit | Attending: Internal Medicine | Admitting: Internal Medicine

## 2023-04-03 DIAGNOSIS — R079 Chest pain, unspecified: Secondary | ICD-10-CM | POA: Insufficient documentation

## 2023-04-07 ENCOUNTER — Telehealth: Payer: Self-pay

## 2023-04-07 NOTE — Telephone Encounter (Signed)
I spoke with radiology and they said its taking about 7 days to get readings back. The patient has been notified.

## 2023-04-07 NOTE — Telephone Encounter (Signed)
Patient asking for anatomical reading on cardiac calcium score.

## 2023-04-14 ENCOUNTER — Ambulatory Visit (INDEPENDENT_AMBULATORY_CARE_PROVIDER_SITE_OTHER): Payer: Medicare Other | Admitting: Internal Medicine

## 2023-04-14 ENCOUNTER — Encounter: Payer: Self-pay | Admitting: Internal Medicine

## 2023-04-14 VITALS — BP 96/60 | HR 68 | Resp 16 | Ht 63.0 in | Wt 97.2 lb

## 2023-04-14 DIAGNOSIS — R0789 Other chest pain: Secondary | ICD-10-CM | POA: Diagnosis not present

## 2023-04-14 DIAGNOSIS — Z87891 Personal history of nicotine dependence: Secondary | ICD-10-CM | POA: Diagnosis not present

## 2023-04-14 DIAGNOSIS — Z8659 Personal history of other mental and behavioral disorders: Secondary | ICD-10-CM | POA: Diagnosis not present

## 2023-04-14 MED ORDER — CYCLOBENZAPRINE HCL 10 MG PO TABS: 10.0000 mg | ORAL_TABLET | Freq: Every day | ORAL | 1 refills | Status: DC

## 2023-04-14 MED ORDER — MELOXICAM 15 MG PO TABS
15.0000 mg | ORAL_TABLET | Freq: Every day | ORAL | 0 refills | Status: DC
Start: 2023-04-14 — End: 2024-08-27

## 2023-04-14 NOTE — Progress Notes (Signed)
Patient Care Team: Margaree Mackintosh, MD as PCP - General (Internal Medicine)  Visit Date: 04/14/23  Subjective:    Patient ID: Paige Lawrence , Female   DOB: June 01, 1956, 67 y.o.    MRN: [id number]   67 y.o. Female presents today for muscle pain in left chest area that moves to the right for the past few weeks. Worse when sitting. Better when lying down. Does not tolerate steroids.  Smoking less since last visit here. 04/08/23 chest CT showed no significant extracardiac findings.  Past Medical History:  Diagnosis Date   Aortic atherosclerosis (HCC)    Cholelithiasis    Colon polyps    DDD (degenerative disc disease), lumbar    Diverticulosis    GERD (gastroesophageal reflux disease)    Hemorrhoids    Herniated disc, cervical    c6-7   Hiatal hernia    Hx of spinal stenosis    Hypoglycemic reaction    Internal hemorrhoids    Mitral valve prolapse    Osteopenia    -1.7 2/05; -1.4 3/07; -2.2 7/11   Uterine fibroid    Vitamin D deficiency    26 4/10; 28 5/11     Family History  Problem Relation Age of Onset   Cancer Maternal Aunt        colon   Colon cancer Maternal Aunt    Hypertension Mother    Colon polyps Father    Heart disease Brother    Diabetes Paternal Aunt    Breast cancer Cousin        maternal cousin   Cancer Son        Appendix    Social history: She continues to smoke.  Works as an Print production planner.  Past medical history: History of anxiety.  History of fractured clavicle in the remote past.  C-section 1980.  Had colonoscopy at [facility] in 2013 showing 4 hyperplastic polyps.  Admitted to [facility] long with diverticulitis in August 2021.  Was thought to have had an allergic reaction to Cipro which had been prescribed for diverticulitis.  Subsequently went to med [facility] and was given Augmentin and stated.  Subsequently went to [facility] in [place] with complaint of cystitis.  Had no evidence of diverticulitis on CT.  It was felt she had  an allergy to IV contrast media.  Was confirmed to have a UTI with this hospitalization and was treated with IV Rocephin.  She had urinary retention and has to nail do regular in and out urine caths intermittently.  Has also been to pelvic floor physical therapy. Review of Systems  Constitutional:  Negative for fever and malaise/fatigue.  HENT:  Negative for congestion.   Eyes:  Negative for blurred vision.  Respiratory:  Negative for cough and shortness of breath.   Cardiovascular:  Negative for chest pain, palpitations and leg swelling.  Gastrointestinal:  Negative for vomiting.  Musculoskeletal:  Positive for myalgias (Left chest wall). Negative for back pain.  Skin:  Negative for rash.  Neurological:  Negative for loss of consciousness and headaches.        Objective:   Vitals: BP 96/60   Pulse 68   Resp 16   Ht 5\' 3"  (1.6 m)   Wt 97 lb 4 oz (44.1 kg)   LMP 11/04/1996   SpO2 98%   BMI 17.23 kg/m    Physical Exam Vitals and nursing note reviewed.  Constitutional:      General: She is not in acute  distress.    Appearance: Normal appearance. She is not toxic-appearing.  HENT:     Head: Normocephalic and atraumatic.  Pulmonary:     Effort: Pulmonary effort is normal.  Musculoskeletal:     Comments: Left chest wall tenderness.  Skin:    General: Skin is warm and dry.  Neurological:     Mental Status: She is alert and oriented to person, place, and time. Mental status is at baseline.  Psychiatric:        Mood and Affect: Mood normal.        Behavior: Behavior normal.        Thought Content: Thought content normal.        Judgment: Judgment normal.      Results:   Studies obtained and personally reviewed by me:  04/08/23 chest CT showed no significant extracardiac findings.  Labs:       Component Value Date/Time   NA 143 03/14/2023 1010   K 4.9 03/14/2023 1010   CL 104 03/14/2023 1010   CO2 27 03/14/2023 1010   GLUCOSE 76 03/14/2023 1010   BUN 9 03/14/2023  1010   CREATININE 0.75 03/14/2023 1010   CALCIUM 10.7 (H) 03/14/2023 1010   PROT 8.0 03/14/2023 1010   ALBUMIN 4.4 08/20/2022 1311   AST 27 03/14/2023 1010   ALT 19 03/14/2023 1010   ALKPHOS 94 08/20/2022 1311   BILITOT 1.0 03/14/2023 1010   GFRNONAA 86 08/21/2020 0948   GFRAA 100 08/21/2020 0948     Lab Results  Component Value Date   WBC 7.8 03/14/2023   HGB 17.8 (H) 03/14/2023   HCT 51.4 (H) 03/14/2023   MCV 101.4 (H) 03/14/2023   PLT 253 03/14/2023    Lab Results  Component Value Date   CHOL 177 09/16/2022   HDL 71 09/16/2022   LDLCALC 93 09/16/2022   TRIG 51 09/16/2022   CHOLHDL 2.5 09/16/2022    No results found for: "HGBA1C"   Lab Results  Component Value Date   TSH 2.29 03/14/2023      Assessment & Plan:   Musculoskeletal left chest wall: prescribed Flexeril 10 mg at bedtime, meloxicam 15 mg daily. Contact us if symptoms do not improve.Have suggested patient contact a physical therapist she has used in the past for PT. We are happy to order this. She says she has a contact number for the therapist.    I,Alexander Ruley,acting as a scribe for Margaree Mackintosh, MD.,have documented all relevant documentation on the behalf of Margaree Mackintosh, MD,as directed by  Margaree Mackintosh, MD while in the presence of Margaree Mackintosh, MD.   I, Margaree Mackintosh, MD, have reviewed all documentation for this visit. The documentation on 04/27/23 for the exam, diagnosis, procedures, and orders are all accurate and complete.

## 2023-04-18 ENCOUNTER — Encounter: Payer: Self-pay | Admitting: Internal Medicine

## 2023-04-18 ENCOUNTER — Emergency Department (HOSPITAL_COMMUNITY)
Admission: EM | Admit: 2023-04-18 | Discharge: 2023-04-18 | Disposition: A | Discharge status: Home / Self Care | Payer: Medicare Other | Attending: Emergency Medicine | Admitting: Emergency Medicine

## 2023-04-18 ENCOUNTER — Telehealth: Payer: Self-pay | Admitting: Internal Medicine

## 2023-04-18 ENCOUNTER — Encounter (HOSPITAL_COMMUNITY): Payer: Self-pay

## 2023-04-18 ENCOUNTER — Other Ambulatory Visit: Payer: Self-pay

## 2023-04-18 DIAGNOSIS — R42 Dizziness and giddiness: Secondary | ICD-10-CM

## 2023-04-18 MED ORDER — MECLIZINE HCL 25 MG PO TABS
25.0000 mg | ORAL_TABLET | Freq: Once | ORAL | Status: AC
  Administered 2023-04-18: 25 mg via ORAL
  Filled 2023-04-18: qty 1

## 2023-04-18 MED ORDER — ACETAMINOPHEN 325 MG PO TABS
650.0000 mg | ORAL_TABLET | Freq: Once | ORAL | Status: AC
  Administered 2023-04-18: 650 mg via ORAL
  Filled 2023-04-18: qty 2

## 2023-04-18 MED ORDER — DIAZEPAM 2 MG PO TABS
2.0000 mg | ORAL_TABLET | Freq: Once | ORAL | Status: AC
  Administered 2023-04-18: 2 mg via ORAL
  Filled 2023-04-18: qty 1

## 2023-04-18 MED ORDER — DIAZEPAM 2 MG PO TABS: 2.0000 mg | ORAL_TABLET | Freq: Four times a day (QID) | ORAL | 0 refills | Status: DC | PRN

## 2023-04-18 MED ORDER — MECLIZINE HCL 25 MG PO TABS: 25.0000 mg | ORAL_TABLET | Freq: Three times a day (TID) | ORAL | 0 refills | Status: DC | PRN

## 2023-04-18 NOTE — ED Provider Notes (Signed)
[place] EMERGENCY DEPARTMENT AT [facility] Provider Note   CSN: [id number] Arrival date & time: 04/18/23  1422     History  Chief Complaint  Patient presents with   Dizziness    Paige Lawrence is a 67 y.o. female.  67 year old female with history of vertigo presents with acute episode of dizziness after she turns her head suddenly.  States it feels like her vertigo like she has had before in the past.  Denies any vomiting with this.  No severe headaches.  No focal neurological weakness.  Did take Dramamine prior to arrival which did help somewhat.       Home Medications Prior to Admission medications   Medication Sig Start Date End Date Taking? Authorizing Provider  alendronate (FOSAMAX) 70 MG tablet Take 1 tablet (70 mg total) by mouth every 7 (seven) days. Take with a full glass of water on an empty stomach. 01/14/22   Genia Del, MD  ALPRAZolam Prudy Feeler) 0.5 MG tablet Take 1 tablet by mouth twice daily as needed for anxiety 10/13/22   Margaree Mackintosh, MD  cyclobenzaprine (FLEXERIL) 10 MG tablet Take 1 tablet (10 mg total) by mouth at bedtime. 04/14/23   Margaree Mackintosh, MD  ergocalciferol (DRISDOL) 1.25 MG (50000 UT) capsule Take 1 capsule (50,000 Units total) by mouth once a week. 08/24/20   Margaree Mackintosh, MD  meloxicam (MOBIC) 15 MG tablet Take 1 tablet (15 mg total) by mouth daily. 04/14/23   Margaree Mackintosh, MD  pantoprazole (PROTONIX) 40 MG tablet Take 1 tablet (40 mg total) by mouth daily. 09/09/22   Arnaldo Natal, NP  polyethylene glycol (MIRALAX / GLYCOLAX) 17 g packet Take 17 g by mouth daily.    [provider]  triamcinolone cream (KENALOG) 0.1 % Apply to insect bites 3 times a day for 5- 7 days 02/10/23   Margaree Mackintosh, MD      Allergies    Aspirin, Ibuprofen, Ivp dye [iodinated contrast media], Sulfa antibiotics, and Ciprofloxacin hcl    Review of Systems   Review of Systems  All other systems reviewed and are  negative.   Physical Exam Updated Vital Signs BP (!) 167/101   Pulse 86   Temp 98.6 F (37 C) (Oral)   Resp 18   LMP 11/04/1996   SpO2 99%  Physical Exam Vitals and nursing note reviewed.  Constitutional:      General: She is not in acute distress.    Appearance: Normal appearance. She is well-developed. She is not toxic-appearing.  HENT:     Head: Normocephalic and atraumatic.  Eyes:     General: Lids are normal.     Conjunctiva/sclera: Conjunctivae normal.     Pupils: Pupils are equal, round, and reactive to light.  Neck:     Thyroid: No thyroid mass.     Trachea: No tracheal deviation.  Cardiovascular:     Rate and Rhythm: Normal rate and regular rhythm.     Heart sounds: Normal heart sounds. No murmur heard.    No gallop.  Pulmonary:     Effort: Pulmonary effort is normal. No respiratory distress.     Breath sounds: Normal breath sounds. No stridor. No decreased breath sounds, wheezing, rhonchi or rales.  Abdominal:     General: There is no distension.     Palpations: Abdomen is soft.     Tenderness: There is no abdominal tenderness. There is no rebound.  Musculoskeletal:  General: No tenderness. Normal range of motion.     Cervical back: Normal range of motion and neck supple.  Skin:    General: Skin is warm and dry.     Findings: No abrasion or rash.  Neurological:     General: No focal deficit present.     Mental Status: She is alert and oriented to person, place, and time. Mental status is at baseline.     GCS: GCS eye subscore is 4. GCS verbal subscore is 5. GCS motor subscore is 6.     Cranial Nerves: No cranial nerve deficit.     Sensory: No sensory deficit.     Motor: Motor function is intact.     Comments: Positive horizontal nystagmus noted  Psychiatric:        Attention and Perception: Attention normal.        Speech: Speech normal.        Behavior: Behavior normal.     ED Results / Procedures / Treatments   Labs (all labs ordered are  listed, but only abnormal results are displayed) Labs Reviewed - No data to display  EKG None  Radiology No results found.  Procedures Procedures    Medications Ordered in ED Medications  meclizine (ANTIVERT) tablet 25 mg (has no administration in time range)  diazepam (VALIUM) tablet 2 mg (has no administration in time range)    ED Course/ Medical Decision Making/ A&P                             Medical Decision Making Risk Prescription drug management.   Patient medicated for likely vertigo here and feels better.  This is most likely peripheral vertigo.  Low suspicion for central vertigo.  Do not feel that she needs to have an MRI at this point.  Will discharge home on Antivert and return precautions given        Final Clinical Impression(s) / ED Diagnoses Final diagnoses:  None    Rx / DC Orders ED Discharge Orders     None         Lorre Nick, MD 04/18/23 1608

## 2023-04-18 NOTE — ED Notes (Signed)
Pt is cautious about taking another Valium at this time. Wants to try meclizine first to see if it helps

## 2023-04-18 NOTE — Telephone Encounter (Addendum)
Paige Lawrence [phone]  William called to say she woke up this morning with the room spinning and unable to get out of bed, she said if she got up she would be dizzy and sick. She wanted to know if you could call something in for her- let her know you are out of office this morning and she could do a Mychart video visit, or go to Urgent care, she said she is unable to do either one of those, she has had to have someone come in to help her because she is unable to get out of bed.    June 14,2024 1:31 pm  Provider Note: Phone call to patient. Spoke with her in person. Speech is coherent and not slurred.    EMS is on site at this time. I have spoken with patient and Female paramedic who is on site. Patient does have Xanax on hand and could try that for vertigo. There is some concern about her BP being elevated. They cannot clearly demonstrate nystagmus on EOM testing.Due to their protocols, EMS personnel advising transport to ED. They are not allowed to transport to office but she could come here by private vehicle.

## 2023-04-18 NOTE — Discharge Instructions (Addendum)
Use the Valium if the Antivert does not work

## 2023-04-18 NOTE — ED Triage Notes (Signed)
BIBA from home for vertigo since this AM, unable to sit up. Pt also experiencing anxiety. 162/90 BP 92 HR 98% room air 113 cbg

## 2023-04-24 ENCOUNTER — Telehealth: Payer: Self-pay

## 2023-04-24 NOTE — Telephone Encounter (Signed)
Transition Care Management Follow-up Telephone Call Date of discharge and from where: ED only [facility] 04/18/23 How have you been since you were released from the hospital? Getting better Any questions or concerns? Yes Patient states that she was given meclizine and valium and has been taking a quarter of the valium with the meclozine and that helps. She is almost out of valium and would like to know if she can change it to the xanax that you give her. If so do meclizine and xanax interact? How many hours should be between the two medications? She does feel like the valium and meclizine are helping and today as her best day yet.  Items Reviewed: Did the pt receive and understand the discharge instructions provided? Yes  Medications obtained and verified? Yes  Other?  N/a Any new allergies since your discharge? No  Dietary orders reviewed? No Do you have support at home? Yes   Home Care and Equipment/Supplies: Were home health services ordered? not applicable If so, what is the name of the agency? N/a  Has the agency set up a time to come to the patient's home? not applicable Were any new equipment or medical supplies ordered?  No What is the name of the medical supply agency? N/a Were you able to get the supplies/equipment? not applicable Do you have any questions related to the use of the equipment or supplies? No  Functional Questionnaire: (I = Independent and D = Dependent) ADLs: I  Bathing/Dressing- i  Meal Prep- i  Eating- i  Maintaining continence- i  Transferring/Ambulation- i  Managing Meds- i  Follow up appointments reviewed:  PCP Hospital f/u appt confirmed? No  Patient is unable to drive and feels like she is getting better and would like to wait a few more days before making a FU appt if needed.  Specialist Hospital f/u appt confirmed? No   Are transportation arrangements needed? Yes - If she has to go anywhere right now she cannot drive.  If their condition worsens, is  the pt aware to call PCP or go to the Emergency Dept.? Yes Was the patient provided with contact information for the PCP's office or ED? Yes Was to pt encouraged to call back with questions or concerns? Yes

## 2023-04-27 NOTE — Patient Instructions (Addendum)
Try Flexeril 10 mg at bedtime and meloxicam 15 mg daily.  Patient knows a physical therapist personally and she may contact that individual for physical therapy for this disorder.  Please stop smoking.

## 2023-05-01 ENCOUNTER — Telehealth: Payer: Self-pay | Admitting: Internal Medicine

## 2023-05-01 MED ORDER — MECLIZINE HCL 25 MG PO TABS: 25.0000 mg | ORAL_TABLET | Freq: Three times a day (TID) | ORAL | 2 refills | Status: DC | PRN

## 2023-05-01 NOTE — Telephone Encounter (Signed)
Patient called to follow up from this, she said that she has been doing better and the meclizine (ANTIVERT) 25 MG tablet has been helping but that she has been out for 2 days. She said at 7am this morning while she was sleeping her head started spinning. She said she can stand and walk and everything right now but she also took a dramamine. She wanted to know if Dr Lenord Fellers can send in some more meclizine. Please advise. Call back is [phone]

## 2023-05-01 NOTE — Telephone Encounter (Signed)
Patient aware.

## 2023-05-01 NOTE — Telephone Encounter (Signed)
Have sent in refill as requested. MJB, MD

## 2023-05-05 ENCOUNTER — Encounter: Payer: Self-pay | Admitting: Internal Medicine

## 2023-05-05 ENCOUNTER — Telehealth: Payer: Self-pay

## 2023-05-05 ENCOUNTER — Ambulatory Visit (INDEPENDENT_AMBULATORY_CARE_PROVIDER_SITE_OTHER): Payer: Medicare Other | Admitting: Internal Medicine

## 2023-05-05 VITALS — BP 92/78 | HR 73 | Resp 16 | Wt 97.2 lb

## 2023-05-05 DIAGNOSIS — F411 Generalized anxiety disorder: Secondary | ICD-10-CM | POA: Diagnosis not present

## 2023-05-05 DIAGNOSIS — H8110 Benign paroxysmal vertigo, unspecified ear: Secondary | ICD-10-CM | POA: Diagnosis not present

## 2023-05-05 DIAGNOSIS — R42 Dizziness and giddiness: Secondary | ICD-10-CM

## 2023-05-05 MED ORDER — ALPRAZOLAM 0.5 MG PO TABS
ORAL_TABLET | ORAL | 1 refills | Status: DC
Start: 2023-05-05 — End: 2024-03-26

## 2023-05-05 NOTE — Progress Notes (Signed)
Patient Care Team: Margaree Mackintosh, MD as PCP - General (Internal Medicine)  Visit Date: 05/05/23  Subjective:    Patient ID: Paige Lawrence , Female   DOB: 23-Oct-1956, 67 y.o.    MRN: [id number]   67 y.o. Female presents today for right ear discomfort, vertigo since 04/18/23. Seen in ED on 04/18/23 for vertigo and given meclizine, diazepam. Meclizine did not give much relief. Describes inner ear tingling, vertigo that occurs when she is lying down and turns her head to the right. Declines post nasal drip. Declines muscle weakness. Has not done anything that would strain her neck. Taking Xanax nightly.  Past Medical History:  Diagnosis Date   Aortic atherosclerosis (HCC)    Cholelithiasis    Colon polyps    DDD (degenerative disc disease), lumbar    Diverticulosis    GERD (gastroesophageal reflux disease)    Hemorrhoids    Herniated disc, cervical    c6-7   Hiatal hernia    Hx of spinal stenosis    Hypoglycemic reaction    Internal hemorrhoids    Mitral valve prolapse    Osteopenia    -1.7 2/05; -1.4 3/07; -2.2 7/11   Uterine fibroid    Vitamin D deficiency    26 4/10; 28 5/11     Family History  Problem Relation Age of Onset   Cancer Maternal Aunt        colon   Colon cancer Maternal Aunt    Hypertension Mother    Colon polyps Father    Heart disease Brother    Diabetes Paternal Aunt    Breast cancer Cousin        maternal cousin   Cancer Son        Appendix    Social History   Social History Narrative   Not on file      Review of Systems  Constitutional:  Negative for fever and malaise/fatigue.  HENT:  Positive for ear pain. Negative for congestion.   Eyes:  Negative for blurred vision.  Respiratory:  Negative for cough and shortness of breath.   Cardiovascular:  Negative for chest pain, palpitations and leg swelling.  Gastrointestinal:  Negative for vomiting.  Musculoskeletal:  Negative for back pain.  Skin:  Negative for rash.  Neurological:   Positive for dizziness and tingling (Inner ears). Negative for loss of consciousness and headaches.        Objective:   Vitals: BP 92/78   Pulse 73   Resp 16   Wt 97 lb 4 oz (44.1 kg)   LMP 11/04/1996   SpO2 98%   BMI 17.23 kg/m    Physical Exam Vitals and nursing note reviewed.  Constitutional:      General: She is not in acute distress.    Appearance: Normal appearance. She is not toxic-appearing.  HENT:     Head: Normocephalic and atraumatic.     Right Ear: Hearing, ear canal and external ear normal.     Left Ear: Hearing, ear canal and external ear normal.     Ears:     Comments: Right, left TM slightly full. Eyes:     Extraocular Movements: Extraocular movements intact.     Pupils: Pupils are equal, round, and reactive to light.     Comments: No nystagmus.  Pulmonary:     Effort: Pulmonary effort is normal.  Musculoskeletal:     Comments: 5/5 muscle strength in all groups tested.  Skin:    General:  Skin is warm and dry.  Neurological:     General: No focal deficit present.     Mental Status: She is alert and oriented to person, place, and time. Mental status is at baseline.     Cranial Nerves: Cranial nerves 2-12 are intact.     Sensory: Sensation is intact.     Motor: Motor function is intact.  Psychiatric:        Mood and Affect: Mood normal.        Behavior: Behavior normal.        Thought Content: Thought content normal.        Judgment: Judgment normal.       Results:   Studies obtained and personally reviewed by me:    Labs:       Component Value Date/Time   NA 143 03/14/2023 1010   K 4.9 03/14/2023 1010   CL 104 03/14/2023 1010   CO2 27 03/14/2023 1010   GLUCOSE 76 03/14/2023 1010   BUN 9 03/14/2023 1010   CREATININE 0.75 03/14/2023 1010   CALCIUM 10.7 (H) 03/14/2023 1010   PROT 8.0 03/14/2023 1010   ALBUMIN 4.4 08/20/2022 1311   AST 27 03/14/2023 1010   ALT 19 03/14/2023 1010   ALKPHOS 94 08/20/2022 1311   BILITOT 1.0 03/14/2023  1010   GFRNONAA 86 08/21/2020 0948   GFRAA 100 08/21/2020 0948     Lab Results  Component Value Date   WBC 7.8 03/14/2023   HGB 17.8 (H) 03/14/2023   HCT 51.4 (H) 03/14/2023   MCV 101.4 (H) 03/14/2023   PLT 253 03/14/2023    Lab Results  Component Value Date   CHOL 177 09/16/2022   HDL 71 09/16/2022   LDLCALC 93 09/16/2022   TRIG 51 09/16/2022   CHOLHDL 2.5 09/16/2022    No results found for: "HGBA1C"   Lab Results  Component Value Date   TSH 2.29 03/14/2023      Assessment & Plan:   Vertigo: referral to vestibular rehab for Epley Maneuver. Will consider neurology referral if no improvement. Refilled alprazolam 0.5 mg three times daily as needed for vertigo  Anxiety state- anxious that something else like a stoke is wrong with her. Can pursue further Imaging and Neurology referral if not improving.    I,Alexander Ruley,acting as a Neurosurgeon for Margaree Mackintosh, MD.,have documented all relevant documentation on the behalf of Margaree Mackintosh, MD,as directed by  Margaree Mackintosh, MD while in the presence of Margaree Mackintosh, MD.   I, Margaree Mackintosh, MD, have reviewed all documentation for this visit. The documentation on 05/05/23 for the exam, diagnosis, procedures, and orders are all accurate and complete.

## 2023-05-05 NOTE — Telephone Encounter (Signed)
Patient is asking for appt to have her ear checked. She states that she has a dull ear ache in her right ear and they did not check her ears at the ED.

## 2023-05-05 NOTE — Telephone Encounter (Signed)
Appt made for 4pm today

## 2023-05-05 NOTE — Patient Instructions (Addendum)
Recommend referral for Epley maneuver at [facility]. Take Xanax instead of Valium up to 3 times daily as needed for vertigo. If not improving after Epley maneuver, will refer to Neurology.

## 2023-05-08 NOTE — Progress Notes (Addendum)
Cardiology Office Note:    Date:  05/09/2023   ID:  Paige Lawrence, DOB 11/02/1956, MRN [id number]  PCP:  Paige Mackintosh, MD  Cardiologist:  None  Electrophysiologist:  None   Referring MD: Paige Mackintosh, MD   Chief Complaint  Patient presents with   Chest Pain    History of Present Illness:    Paige Lawrence is a 67 y.o. female with a hx of GERD, hiatal hernia, spinal stenosis, MVP who is referred by Dr. Lenord Lawrence for evaluation of chest pain.  Calcium score on 04/03/2023 was 29 (66 percentile).  She reports he started having left lower chest pain about 2 months ago, has been occurring daily though none in the last few weeks.  Describes feeling something moving inside of her chest.  Particular notices it with standing.  She walks daily but has not been walking for the last 3 weeks due to having vertigo issues.  She denies any dyspnea, lightheadedness, syncope, lower extremity edema, or palpitations.  She was told that she had mitral valve prolapse in 1980s.  She has smoked for 45 years, currently smoking 0.5 packs/day.  Family history includes half brother had MI in early 10s.   Past Medical History:  Diagnosis Date   Aortic atherosclerosis (HCC)    Cholelithiasis    Colon polyps    DDD (degenerative disc disease), lumbar    Diverticulosis    GERD (gastroesophageal reflux disease)    Hemorrhoids    Herniated disc, cervical    c6-7   Hiatal hernia    Hx of spinal stenosis    Hypoglycemic reaction    Internal hemorrhoids    Mitral valve prolapse    Osteopenia    -1.7 2/05; -1.4 3/07; -2.2 7/11   Uterine fibroid    Vitamin D deficiency    26 4/10; 28 5/11    Past Surgical History:  Procedure Laterality Date   CESAREAN SECTION     COLONOSCOPY  11/16/2020   WISDOM TOOTH EXTRACTION      Current Medications: Current Meds  Medication Sig   alendronate (FOSAMAX) 70 MG tablet Take 1 tablet (70 mg total) by mouth every 7 (seven) days. Take with a full glass of water on an  empty stomach.   ALPRAZolam (XANAX) 0.5 MG tablet One tab by mouth up to 3 times daily as needed for acute vertigo   atorvastatin (LIPITOR) 20 MG tablet Take 1 tablet (20 mg total) by mouth daily.   cyclobenzaprine (FLEXERIL) 10 MG tablet Take 1 tablet (10 mg total) by mouth at bedtime.   ergocalciferol (DRISDOL) 1.25 MG (50000 UT) capsule Take 1 capsule (50,000 Units total) by mouth once a week.   meclizine (ANTIVERT) 25 MG tablet Take 1 tablet (25 mg total) by mouth 3 (three) times daily as needed for dizziness.   meloxicam (MOBIC) 15 MG tablet Take 1 tablet (15 mg total) by mouth daily.   pantoprazole (PROTONIX) 40 MG tablet Take 1 tablet (40 mg total) by mouth daily.   polyethylene glycol (MIRALAX / GLYCOLAX) 17 g packet Take 17 g by mouth daily.   triamcinolone cream (KENALOG) 0.1 % Apply to insect bites 3 times a day for 5- 7 days     Allergies:   Aspirin, Ibuprofen, Ivp dye [iodinated contrast media], Sulfa antibiotics, and Ciprofloxacin hcl   Social History   Socioeconomic History   Marital status: Divorced    Spouse name: Not on file   Number of children: Not on  file   Years of education: Not on file   Highest education level: Not on file  Occupational History   Not on file  Tobacco Use   Smoking status: Every Day    Packs/day: .5    Types: Cigarettes   Smokeless tobacco: Never  Vaping Use   Vaping Use: Never used  Substance and Sexual Activity   Alcohol use: Not Currently   Drug use: No   Sexual activity: Not Currently    Birth control/protection: Post-menopausal    Comment: 1st intercourse 44 yo-5 partners  Other Topics Concern   Not on file  Social History Narrative   Not on file   Social Determinants of Health   Financial Resource Strain: Not on file  Food Insecurity: Not on file  Transportation Needs: Not on file  Physical Activity: Not on file  Stress: Not on file  Social Connections: Not on file     Family History: The patient's family history  includes Breast cancer in her cousin; Cancer in her maternal aunt and son; Colon cancer in her maternal aunt; Colon polyps in her father; Diabetes in her paternal aunt; Heart disease in her brother; Hypertension in her mother.  ROS:   Please see the history of present illness.     All other systems reviewed and are negative.  EKGs/Labs/Other Studies Reviewed:    The following studies were reviewed today:   EKG:   03/14/23: NSR, rate 76, LVH  Recent Labs: 03/14/2023: ALT 19; BUN 9; Creat 0.75; Hemoglobin 17.8; Platelets 253; Potassium 4.9; Sodium 143; TSH 2.29  Recent Lipid Panel    Component Value Date/Time   CHOL 177 09/16/2022 1001   TRIG 51 09/16/2022 1001   HDL 71 09/16/2022 1001   CHOLHDL 2.5 09/16/2022 1001   LDLCALC 93 09/16/2022 1001    Physical Exam:    VS:  BP 123/75 (BP Location: Left Arm, Patient Position: Sitting, Cuff Size: Normal)   Pulse 85   Ht 5\' 3"  (1.6 m)   Wt 92 lb 4.8 oz (41.9 kg)   LMP 11/04/1996   SpO2 96%   BMI 16.35 kg/m     Wt Readings from Last 3 Encounters:  05/09/23 92 lb 4.8 oz (41.9 kg)  05/05/23 97 lb 4 oz (44.1 kg)  04/14/23 97 lb 4 oz (44.1 kg)     GEN:  Well nourished, well developed in no acute distress HEENT: Normal NECK: No JVD; No carotid bruits LYMPHATICS: No lymphadenopathy CARDIAC: RRR, no murmurs, rubs, gallops RESPIRATORY:  Clear to auscultation without rales, wheezing or rhonchi  ABDOMEN: Soft, non-tender, non-distended MUSCULOSKELETAL:  No edema; No deformity  SKIN: Warm and dry NEUROLOGIC:  Alert and oriented x 3 PSYCHIATRIC:  Normal affect   ASSESSMENT:    1. Precordial pain   2. Mitral valve prolapse   3. Coronary artery disease involving native coronary artery of native heart, unspecified whether angina present   4. Tobacco use    PLAN:    Chest pain: Atypical in description but does have CAD risk factors (age, tobacco use).  Calcium score on 04/03/2023 was 29 (66 percentile).  Discussed coronary CTA for  further evaluation but she reports significant contrast allergy.  Will evaluate for ischemia with exercise Myoview.  Mitral valve prolapse: Reported history, will evaluate with echocardiogram  CAD: Calcium score on 04/03/2023 was 29 (66 percentile).  Reporting atypical chest pain, planning exercise Myoview as above -Start atorvastatin 20 mg daily.  LDL 93 on 09/16/2022  Tobacco use: Has  smoked 0.5 packs/day x 45 years.  Counseled on risk of tobacco use and cessation strongly encouraged.  RTC in 4 months  Informed Consent   Shared Decision Making/Informed Consent The risks [chest pain, shortness of breath, cardiac arrhythmias, dizziness, blood pressure fluctuations, myocardial infarction, stroke/transient ischemic attack, nausea, vomiting, allergic reaction, radiation exposure, metallic taste sensation and life-threatening complications (estimated to be 1 in 10,000)], benefits (risk stratification, diagnosing coronary artery disease, treatment guidance) and alternatives of a nuclear stress test were discussed in detail with Ms. Baller and she agrees to proceed.       Medication Adjustments/Labs and Tests Ordered: Current medicines are reviewed at length with the patient today.  Concerns regarding medicines are outlined above.  Orders Placed This Encounter  Procedures   Cardiac Stress Test: Informed Consent Details: Physician/Practitioner Attestation; Transcribe to consent form and obtain patient signature   MYOCARDIAL PERFUSION IMAGING   ECHOCARDIOGRAM COMPLETE   Meds ordered this encounter  Medications   atorvastatin (LIPITOR) 20 MG tablet    Sig: Take 1 tablet (20 mg total) by mouth daily.    Dispense:  90 tablet    Refill:  3    Patient Instructions  Medication Instructions:  Start Atorvastatin 20 mg daily  Continue all other medications *If you need a refill on your cardiac medications before your next appointment, please call your pharmacy*   Lab Work: None  ordered   Testing/Procedures: Stress Myoview  Echo   Follow-Up: At [facility], you and your health needs are our priority.  As part of our continuing mission to provide you with exceptional heart care, we have created designated Provider Care Teams.  These Care Teams include your primary Cardiologist (physician) and Advanced Practice Providers (APPs -  Physician Assistants and Nurse Practitioners) who all work together to provide you with the care you need, when you need it.  We recommend signing up for the patient portal called "MyChart".  Sign up information is provided on this After Visit Summary.  MyChart is used to connect with patients for Virtual Visits (Telemedicine).  Patients are able to view lab/test results, encounter notes, upcoming appointments, etc.  Non-urgent messages can be sent to your provider as well.   To learn more about what you can do with MyChart, go to [email or web address].    Your next appointment:  4 months    Provider:  Dr.Amira Podolak     Signed, Little Ishikawa, MD  05/09/2023 2:17 PM    [facility]

## 2023-05-09 ENCOUNTER — Encounter (HOSPITAL_BASED_OUTPATIENT_CLINIC_OR_DEPARTMENT_OTHER): Payer: Self-pay | Admitting: Cardiology

## 2023-05-09 ENCOUNTER — Ambulatory Visit (HOSPITAL_BASED_OUTPATIENT_CLINIC_OR_DEPARTMENT_OTHER): Payer: Medicare Other | Admitting: Cardiology

## 2023-05-09 VITALS — BP 123/75 | HR 85 | Ht 63.0 in | Wt 92.3 lb

## 2023-05-09 DIAGNOSIS — I341 Nonrheumatic mitral (valve) prolapse: Secondary | ICD-10-CM | POA: Diagnosis not present

## 2023-05-09 DIAGNOSIS — Z720 Tobacco use: Secondary | ICD-10-CM

## 2023-05-09 DIAGNOSIS — I2510 Atherosclerotic heart disease of native coronary artery without angina pectoris: Secondary | ICD-10-CM | POA: Diagnosis not present

## 2023-05-09 DIAGNOSIS — R072 Precordial pain: Secondary | ICD-10-CM | POA: Diagnosis not present

## 2023-05-09 MED ORDER — ATORVASTATIN CALCIUM 20 MG PO TABS: 20.0000 mg | ORAL_TABLET | Freq: Every day | ORAL | 3 refills | Status: DC

## 2023-05-09 NOTE — Patient Instructions (Signed)
Medication Instructions:  Start Atorvastatin 20 mg daily  Continue all other medications *If you need a refill on your cardiac medications before your next appointment, please call your pharmacy*   Lab Work: None ordered   Testing/Procedures: Stress Myoview  Echo   Follow-Up: At [facility], you and your health needs are our priority.  As part of our continuing mission to provide you with exceptional heart care, we have created designated Provider Care Teams.  These Care Teams include your primary Cardiologist (physician) and Advanced Practice Providers (APPs -  Physician Assistants and Nurse Practitioners) who all work together to provide you with the care you need, when you need it.  We recommend signing up for the patient portal called "MyChart".  Sign up information is provided on this After Visit Summary.  MyChart is used to connect with patients for Virtual Visits (Telemedicine).  Patients are able to view lab/test results, encounter notes, upcoming appointments, etc.  Non-urgent messages can be sent to your provider as well.   To learn more about what you can do with MyChart, go to [email or web address].    Your next appointment:  4 months    Provider:  Dr.Schumann

## 2023-05-12 ENCOUNTER — Encounter (HOSPITAL_BASED_OUTPATIENT_CLINIC_OR_DEPARTMENT_OTHER): Payer: Self-pay | Admitting: Cardiology

## 2023-05-12 ENCOUNTER — Other Ambulatory Visit: Payer: Self-pay

## 2023-05-12 ENCOUNTER — Telehealth: Payer: Self-pay | Admitting: Internal Medicine

## 2023-05-12 DIAGNOSIS — H8110 Benign paroxysmal vertigo, unspecified ear: Secondary | ICD-10-CM

## 2023-05-12 NOTE — Telephone Encounter (Signed)
  PT vestibular rehab  This needs to be placed as a referral.  [facility] does this

## 2023-05-14 ENCOUNTER — Encounter: Payer: Self-pay | Admitting: Physical Therapy

## 2023-05-14 ENCOUNTER — Ambulatory Visit: Payer: Medicare Other | Attending: Internal Medicine | Admitting: Physical Therapy

## 2023-05-14 DIAGNOSIS — R2681 Unsteadiness on feet: Secondary | ICD-10-CM | POA: Diagnosis present

## 2023-05-14 DIAGNOSIS — M6281 Muscle weakness (generalized): Secondary | ICD-10-CM | POA: Insufficient documentation

## 2023-05-14 DIAGNOSIS — R42 Dizziness and giddiness: Secondary | ICD-10-CM | POA: Diagnosis present

## 2023-05-14 DIAGNOSIS — R262 Difficulty in walking, not elsewhere classified: Secondary | ICD-10-CM | POA: Diagnosis present

## 2023-05-14 DIAGNOSIS — R278 Other lack of coordination: Secondary | ICD-10-CM | POA: Diagnosis present

## 2023-05-14 NOTE — Therapy (Signed)
OUTPATIENT PHYSICAL THERAPY VESTIBULAR EVALUATION     Patient Name: Paige Lawrence MRN: [id number] DOB:05-Jan-1956, 67 y.o., female Today's Date: 05/14/2023  END OF SESSION:  PT End of Session - 05/14/23 1736     Visit Number 1    Date for PT Re-Evaluation 07/23/23    PT Start Time 1549    PT Stop Time 1620    PT Time Calculation (min) 31 min    Activity Tolerance Patient tolerated treatment well    Behavior During Therapy [facility] for tasks assessed/performed             Past Medical History:  Diagnosis Date   Aortic atherosclerosis (HCC)    Cholelithiasis    Colon polyps    DDD (degenerative disc disease), lumbar    Diverticulosis    GERD (gastroesophageal reflux disease)    Hemorrhoids    Herniated disc, cervical    c6-7   Hiatal hernia    Hx of spinal stenosis    Hypoglycemic reaction    Internal hemorrhoids    Mitral valve prolapse    Osteopenia    -1.7 2/05; -1.4 3/07; -2.2 7/11   Uterine fibroid    Vitamin D deficiency    26 4/10; 28 5/11   Past Surgical History:  Procedure Laterality Date   CESAREAN SECTION     COLONOSCOPY  11/16/2020   WISDOM TOOTH EXTRACTION     Patient Active Problem List   Diagnosis Date Noted   Osteoporosis 02/22/2023   History of anxiety 02/22/2023   LLQ pain 09/03/2022   Diverticulitis of colon 08/09/2021   COPD (chronic obstructive pulmonary disease) (HCC) 03/06/2012   Palpitations 03/06/2012   History of smoking 08/23/2011    PCP: Margaree Mackintosh, MD REFERRING PROVIDER: Margaree Mackintosh, MD  REFERRING DIAG: H81.10 (ICD-10-CM) - Benign paroxysmal positional vertigo, unspecified laterality   THERAPY DIAG:  Dizziness and giddiness  Difficulty in walking, not elsewhere classified  Other lack of coordination  Unsteadiness on feet  Muscle weakness (generalized)  ONSET DATE: 05/12/23  Rationale for Evaluation and Treatment: Rehabilitation  SUBJECTIVE:   SUBJECTIVE STATEMENT: Patient reports that several weeks  ago she woke up with an"electrical current" in her head. She then developed severe dizziness with R head turns. She was unable to get out of the bed due to the dizziness. She went to the hospital. The Dr noted some nystagmus. She started some meclizine which seemed to relieve the dizziness, but she awoke again on Thursday. She has taken her Meclizine today already. She reports previous episode of vertigo 30 years ago.   PERTINENT HISTORY:  Per Referring Physician note 05/05/23 67 y.o. Female presents today for right ear discomfort, vertigo since 04/18/23. Seen in ED on 04/18/23 for vertigo and given meclizine, diazepam. Meclizine did not give much relief. Describes inner ear tingling, vertigo that occurs when she is lying down and turns her head to the right. Declines post nasal drip. Declines muscle weakness. Has not done anything that would strain her neck. Taking Xanax nightly   PAIN:  Are you having pain? No  PRECAUTIONS: Fall  WEIGHT BEARING RESTRICTIONS: No  FALLS: Has patient fallen in last 6 months? No  LIVING ENVIRONMENT: Lives with: lives alone Lives in: House/apartment Has following equipment at home: None Occupation: [employer], 2 jobs involving using her computer.  PLOF: Independent  PATIENT GOALS: eliminate her dizziness  OBJECTIVE:   DIAGNOSTIC FINDINGS: N/A  COGNITION: Overall cognitive status: Within functional limits for tasks assessed  SENSATION: WFL  EDEMA:  Patient denies  MUSCLE TONE:  WNL  POSTURE:  rounded shoulders and increased thoracic kyphosis  Cervical ROM:  She reports WFL, but dizziness with R head rotation.   A/PROM (deg) eval  Flexion   Extension   Right lateral flexion   Left lateral flexion   Right rotation   Left rotation   (Blank rows = not tested)  LOWER EXTREMITY MMT: deferred at evaluation  MMT Right eval Left eval  Hip flexion    Hip abduction    Hip adduction    Hip internal rotation    Hip external rotation    Knee  flexion    Knee extension    Ankle dorsiflexion    Ankle plantarflexion    Ankle inversion    Ankle eversion    (Blank rows = not tested)  BED MOBILITY:  I TRANSFERS: I CURB: Complete Independence  GAIT: Gait pattern:  slow pace, no head turns/scanning, shuffling, and trunk flexed Distance walked: in clinic distances Assistive device utilized: None Level of assistance: Complete Independence Comments: Patient very cautious during gait due to fear of triggering dizziness. She is self limiting her walking and all mobility due to fear of falling  VESTIBULAR ASSESSMENT:  GENERAL OBSERVATION:    SYMPTOM BEHAVIOR:  Subjective history: Patient reports that several weeks ago she woke up with an"electrical current" in her head. She then developed severe dizziness with R head turns. She was unable to get out of the bed due to the dizziness. She went to the hospital. The Dr noted some nystagmus. She started some meclizine which seemed to relieve the dizziness, but she awoke again on Thursday. She has taken her Meclizine today already. She reports previous episode of vertigo 30 years ago.  Non-Vestibular symptoms:  buzzing through entire R side of head, inconsistent.  Type of dizziness: Unsteady with head/body turns and "Funny feeling in the head"  Frequency: with R head turns, riding in car duirng U-turns  Duration: fleeting  Aggravating factors: Induced by position change: lying supine, rolling to the right, and supine to sit  Relieving factors: head stationary  Progression of symptoms: unchanged  OCULOMOTOR EXAM:  Ocular Alignment:   Ocular ROM:   Spontaneous Nystagmus:   Gaze-Induced Nystagmus:   Smooth Pursuits:   Saccades:   Convergence/Divergence:  cm   VESTIBULAR - OCULAR REFLEX:   Slow VOR:   VOR Cancellation:   Head-Impulse Test:   Dynamic Visual Acuity:    POSITIONAL TESTING: Right Dix-Hallpike: no nystagmus and dizziness Left Dix-Hallpike: no nystagmus  MOTION  SENSITIVITY:  Motion Sensitivity Quotient Intensity: 0 = none, 1 = Lightheaded, 2 = Mild, 3 = Moderate, 4 = Severe, 5 = Vomiting  Intensity  1. Sitting to supine   2. Supine to L side   3. Supine to R side   4. Supine to sitting   5. L Hallpike-Dix   6. Up from L    7. R Hallpike-Dix   8. Up from R    9. Sitting, head tipped to L knee   10. Head up from L knee   11. Sitting, head tipped to R knee   12. Head up from R knee   13. Sitting head turns x5   14.Sitting head nods x5   15. In stance, 180 turn to L    16. In stance, 180 turn to R     OTHOSTATICS:   FUNCTIONAL GAIT:    VESTIBULAR TREATMENT:  DATE:  05/14/23 Education  Canalith Repositioning:  Epley Right: Number of Reps: 1 and Response to Treatment: symptoms improved Gaze Adaptation:   Habituation:   Other:   PATIENT EDUCATION: Education details: POC, BPPV causes and treatment Person educated: Patient Education method: Explanation Education comprehension: verbalized understanding  HOME EXERCISE PROGRAM:  GOALS: Goals reviewed with patient? Yes  SHORT TERM GOALS: Target date: 05/28/23  I with final HEP Baseline: Goal status: INITIAL  LONG TERM GOALS: Target date: 07/23/23  I with final HEP Baseline:  Goal status: INITIAL  2.  Patient will report resolution of her dizziness with head turns and while riding in the car. Baseline:  Goal status: INITIAL  3.   Baseline:  Goal status:   4.   Baseline:  Goal status:   5.   Baseline:  Goal status:   6.   Baseline:  Goal status:   ASSESSMENT:  CLINICAL IMPRESSION: Patient is a 67 y.o. who was seen today for physical therapy evaluation and treatment for R BPPV. Patient has had several weeks of dizziness, noted when she first woke up one morning. She could not get out of bed and had to go to hospital in an ambulance. She was prescribed  Meclizine, which seemed to help and symptoms resolved. They returned after stopping the medication so she got a new prescription. Unfortunately she had taken her medication this morning, so testing was very limited due to suppression of her symptoms. Performed Dix-Hallpike with mild dizziness on the R. Performed Epley. Patient felt she was walking better after treatment, but not sure if it would last. Plan is to return for re-assessment of Dix-Hallpike along with full vestibular assessment without having her medication in her for more accurate results.  OBJECTIVE IMPAIRMENTS: Abnormal gait, decreased balance, and dizziness.   ACTIVITY LIMITATIONS: bending, sitting, standing, sleeping, and locomotion level  PARTICIPATION LIMITATIONS: cleaning, driving, shopping, community activity, and occupation  PERSONAL FACTORS: Past/current experiences are also affecting patient's functional outcome.   REHAB POTENTIAL: Good  CLINICAL DECISION MAKING: Evolving/moderate complexity  EVALUATION COMPLEXITY: Moderate   PLAN:  PT FREQUENCY: 1-2x/week  PT DURATION: 10 weeks  PLANNED INTERVENTIONS: Therapeutic exercises, Therapeutic activity, Neuromuscular re-education, Balance training, Gait training, Patient/Family education, Self Care, Joint mobilization, Vestibular training, Canalith repositioning, and Visual/preceptual remediation/compensation  PLAN FOR NEXT SESSION: Vestibular assessment when patient has not taken her dizziness medication.   Iona Beard, PT 05/14/2023, 5:44 PM

## 2023-05-19 NOTE — Therapy (Signed)
OUTPATIENT PHYSICAL THERAPY VESTIBULAR TREATMENT     Patient Name: Paige Lawrence MRN: [id number] DOB:Mar 28, 1956, 67 y.o., female Today's Date: 05/20/2023  END OF SESSION:  PT End of Session - 05/20/23 1753     Visit Number 2    Date for PT Re-Evaluation 07/23/23    Authorization Type [employer] Medicare    PT Start Time 1800    PT Stop Time 1845    PT Time Calculation (min) 45 min    Activity Tolerance Patient tolerated treatment well    Behavior During Therapy WFL for tasks assessed/performed              Past Medical History:  Diagnosis Date   Aortic atherosclerosis (HCC)    Cholelithiasis    Colon polyps    DDD (degenerative disc disease), lumbar    Diverticulosis    GERD (gastroesophageal reflux disease)    Hemorrhoids    Herniated disc, cervical    c6-7   Hiatal hernia    Hx of spinal stenosis    Hypoglycemic reaction    Internal hemorrhoids    Mitral valve prolapse    Osteopenia    -1.7 2/05; -1.4 3/07; -2.2 7/11   Uterine fibroid    Vitamin D deficiency    26 4/10; 28 5/11   Past Surgical History:  Procedure Laterality Date   CESAREAN SECTION     COLONOSCOPY  11/16/2020   WISDOM TOOTH EXTRACTION     Patient Active Problem List   Diagnosis Date Noted   Osteoporosis 02/22/2023   History of anxiety 02/22/2023   LLQ pain 09/03/2022   Diverticulitis of colon 08/09/2021   COPD (chronic obstructive pulmonary disease) (HCC) 03/06/2012   Palpitations 03/06/2012   History of smoking 08/23/2011    PCP: Margaree Mackintosh, MD REFERRING PROVIDER: Margaree Mackintosh, MD  REFERRING DIAG: H81.10 (ICD-10-CM) - Benign paroxysmal positional vertigo, unspecified laterality   THERAPY DIAG:  Dizziness and giddiness  Difficulty in walking, not elsewhere classified  Other lack of coordination  Unsteadiness on feet  Muscle weakness (generalized)  ONSET DATE: 05/12/23  Rationale for Evaluation and Treatment: Rehabilitation  SUBJECTIVE:   SUBJECTIVE  STATEMENT: I am doing good. I think the maneuver last time did the trick. I have not taken any medicine for the dizziness in a couple of days.    PERTINENT HISTORY:  Per Referring Physician note 05/05/23 67 y.o. Female presents today for right ear discomfort, vertigo since 04/18/23. Seen in ED on 04/18/23 for vertigo and given meclizine, diazepam. Meclizine did not give much relief. Describes inner ear tingling, vertigo that occurs when she is lying down and turns her head to the right. Declines post nasal drip. Declines muscle weakness. Has not done anything that would strain her neck. Taking Xanax nightly   PAIN:  Are you having pain? No  PRECAUTIONS: Fall  WEIGHT BEARING RESTRICTIONS: No  FALLS: Has patient fallen in last 6 months? No  LIVING ENVIRONMENT: Lives with: lives alone Lives in: House/apartment Has following equipment at home: None Occupation: [employer], 2 jobs involving using her computer.  PLOF: Independent  PATIENT GOALS: eliminate her dizziness  OBJECTIVE:   DIAGNOSTIC FINDINGS: N/A  COGNITION: Overall cognitive status: Within functional limits for tasks assessed   SENSATION: WFL  EDEMA:  Patient denies  MUSCLE TONE:  WNL  POSTURE:  rounded shoulders and increased thoracic kyphosis  Cervical ROM:  She reports WFL, but dizziness with R head rotation.   A/PROM (deg) eval  Flexion  Extension   Right lateral flexion   Left lateral flexion   Right rotation   Left rotation   (Blank rows = not tested)  LOWER EXTREMITY MMT: deferred at evaluation  MMT Right eval Left eval  Hip flexion    Hip abduction    Hip adduction    Hip internal rotation    Hip external rotation    Knee flexion    Knee extension    Ankle dorsiflexion    Ankle plantarflexion    Ankle inversion    Ankle eversion    (Blank rows = not tested)  BED MOBILITY:  I TRANSFERS: I CURB: Complete Independence  GAIT: Gait pattern:  slow pace, no head turns/scanning, shuffling,  and trunk flexed Distance walked: in clinic distances Assistive device utilized: None Level of assistance: Complete Independence Comments: Patient very cautious during gait due to fear of triggering dizziness. She is self limiting her walking and all mobility due to fear of falling  VESTIBULAR ASSESSMENT:  GENERAL OBSERVATION:    SYMPTOM BEHAVIOR:  Subjective history: Patient reports that several weeks ago she woke up with an"electrical current" in her head. She then developed severe dizziness with R head turns. She was unable to get out of the bed due to the dizziness. She went to the hospital. The Dr noted some nystagmus. She started some meclizine which seemed to relieve the dizziness, but she awoke again on Thursday. She has taken her Meclizine today already. She reports previous episode of vertigo 30 years ago.  Non-Vestibular symptoms:  buzzing through entire R side of head, inconsistent.  Type of dizziness: Unsteady with head/body turns and "Funny feeling in the head"  Frequency: with R head turns, riding in car duirng U-turns  Duration: fleeting  Aggravating factors: Induced by position change: lying supine, rolling to the right, and supine to sit  Relieving factors: head stationary  Progression of symptoms: unchanged  OCULOMOTOR EXAM:  Ocular Alignment:   Ocular ROM:   Spontaneous Nystagmus:   Gaze-Induced Nystagmus:   Smooth Pursuits:   Saccades:   Convergence/Divergence:  cm   VESTIBULAR - OCULAR REFLEX:   Slow VOR:   VOR Cancellation:   Head-Impulse Test:   Dynamic Visual Acuity:    POSITIONAL TESTING: Right Dix-Hallpike: no nystagmus and dizziness Left Dix-Hallpike: no nystagmus  MOTION SENSITIVITY:  Motion Sensitivity Quotient Intensity: 0 = none, 1 = Lightheaded, 2 = Mild, 3 = Moderate, 4 = Severe, 5 = Vomiting  Intensity  1. Sitting to supine   2. Supine to L side   3. Supine to R side   4. Supine to sitting   5. L Hallpike-Dix   6. Up from L    7. R  Hallpike-Dix   8. Up from R    9. Sitting, head tipped to L knee   10. Head up from L knee   11. Sitting, head tipped to R knee   12. Head up from R knee   13. Sitting head turns x5   14.Sitting head nods x5   15. In stance, 180 turn to L    16. In stance, 180 turn to R     OTHOSTATICS:   FUNCTIONAL GAIT:    VESTIBULAR TREATMENT:  DATE:  05/20/23 Reassess Gilberto Better, Epley on R side- clear  VOR exercises  NuStep L5 x57mins Standing on airex, then EC On airex catching ball  Walking with eyes on target forwards and backwards with head turns   05/14/23 Education  Canalith Repositioning:  Epley Right: Number of Reps: 1 and Response to Treatment: symptoms improved Gaze Adaptation:   Habituation:   Other:   PATIENT EDUCATION: Education details: POC, BPPV causes and treatment Person educated: Patient Education method: Explanation Education comprehension: verbalized understanding  HOME EXERCISE PROGRAM: Access Code: [email or web address] URL: [email or web address] Date: 05/20/2023 Prepared by: Cassie Freer  Exercises - Standing Gaze Stabilization with Head Rotation  - 1 x daily - 7 x weekly - 2 sets - 10 reps - Standing Gaze Stabilization with Head Nod  - 1 x daily - 7 x weekly - 2 sets - 10 reps - Standing VOR Cancellation  - 1 x daily - 7 x weekly - 2 sets - 10 reps - Standing Gaze Stabilization with Two Near Targets and Head Rotation  - 1 x daily - 7 x weekly - 2 sets - 10 reps  GOALS: Goals reviewed with patient? Yes  SHORT TERM GOALS: Target date: 05/28/23  I with final HEP Baseline: given 05/20/23 Goal status: INITIAL  LONG TERM GOALS: Target date: 07/23/23  I with final HEP Baseline:  Goal status: INITIAL  2.  Patient will report resolution of her dizziness with head turns and while riding in the car. Baseline:  Goal status: INITIAL  3.  Patient will  report no dizziness in a 4 week period  Baseline:  Goal status: INITIAL    ASSESSMENT:  CLINICAL IMPRESSION: Patient returns with no dizziness. She reports  she has stopped taking the Antivert for a few days and has not experienced any BPPV symptoms. We reassessed with Dix-Hallpike and she wanted to try another Epley maneuver as she feels like it really helped last time. Canals were clear and no symptom provocation. We tested multitasking doing walking and head turns as well as some balance assessments. She is able to do tandem stance and SLS and all walking tasks without difficulty or dizziness. Patient reports she feels back to normal. Created HEP with some VOR exercises to maintain her progress thus far. Does really well with interventions today. If she continues to feel good, can probably discharge her.   OBJECTIVE IMPAIRMENTS: Abnormal gait, decreased balance, and dizziness.   ACTIVITY LIMITATIONS: bending, sitting, standing, sleeping, and locomotion level  PARTICIPATION LIMITATIONS: cleaning, driving, shopping, community activity, and occupation  PERSONAL FACTORS: Past/current experiences are also affecting patient's functional outcome.   REHAB POTENTIAL: Good  CLINICAL DECISION MAKING: Evolving/moderate complexity  EVALUATION COMPLEXITY: Moderate   PLAN:  PT FREQUENCY: 1-2x/week  PT DURATION: 10 weeks  PLANNED INTERVENTIONS: Therapeutic exercises, Therapeutic activity, Neuromuscular re-education, Balance training, Gait training, Patient/Family education, Self Care, Joint mobilization, Vestibular training, Canalith repositioning, and Visual/preceptual remediation/compensation  PLAN FOR NEXT SESSION: multitasking, high level balance. VOR, see if anything provokes the dizziness   Cassie Freer, PT 05/20/2023, 6:41 PM

## 2023-05-20 ENCOUNTER — Ambulatory Visit: Payer: Medicare Other

## 2023-05-20 DIAGNOSIS — R42 Dizziness and giddiness: Secondary | ICD-10-CM | POA: Diagnosis not present

## 2023-05-20 DIAGNOSIS — R262 Difficulty in walking, not elsewhere classified: Secondary | ICD-10-CM

## 2023-05-20 DIAGNOSIS — R2681 Unsteadiness on feet: Secondary | ICD-10-CM

## 2023-05-20 DIAGNOSIS — M6281 Muscle weakness (generalized): Secondary | ICD-10-CM

## 2023-05-20 DIAGNOSIS — R278 Other lack of coordination: Secondary | ICD-10-CM

## 2023-05-26 NOTE — Therapy (Signed)
OUTPATIENT PHYSICAL THERAPY VESTIBULAR TREATMENT     Patient Name: SHAREE STURDY MRN: [id number] DOB:[birth year], 67 y.o., female Today's Date: 05/27/2023  END OF SESSION:  PT End of Session - 05/27/23 1628     Visit Number 3    Date for PT Re-Evaluation 07/23/23    Authorization Type [employer] Medicare    PT Start Time 1630    PT Stop Time 1710    PT Time Calculation (min) 40 min    Activity Tolerance Patient tolerated treatment well    Behavior During Therapy WFL for tasks assessed/performed               Past Medical History:  Diagnosis Date   Aortic atherosclerosis (HCC)    Cholelithiasis    Colon polyps    DDD (degenerative disc disease), lumbar    Diverticulosis    GERD (gastroesophageal reflux disease)    Hemorrhoids    Herniated disc, cervical    c6-7   Hiatal hernia    Hx of spinal stenosis    Hypoglycemic reaction    Internal hemorrhoids    Mitral valve prolapse    Osteopenia    -1.7 2/05; -1.4 3/07; -2.2 7/11   Uterine fibroid    Vitamin D deficiency    26 4/10; 28 5/11   Past Surgical History:  Procedure Laterality Date   CESAREAN SECTION     COLONOSCOPY  11/16/2020   WISDOM TOOTH EXTRACTION     Patient Active Problem List   Diagnosis Date Noted   Osteoporosis 02/22/2023   History of anxiety 02/22/2023   LLQ pain 09/03/2022   Diverticulitis of colon 08/09/2021   COPD (chronic obstructive pulmonary disease) (HCC) 03/06/2012   Palpitations 03/06/2012   History of smoking 08/23/2011    PCP: Margaree Mackintosh, MD REFERRING PROVIDER: Margaree Mackintosh, MD  REFERRING DIAG: H81.10 (ICD-10-CM) - Benign paroxysmal positional vertigo, unspecified laterality   THERAPY DIAG:  Dizziness and giddiness  Difficulty in walking, not elsewhere classified  Other lack of coordination  Unsteadiness on feet  Muscle weakness (generalized)  ONSET DATE: 05/12/23  Rationale for Evaluation and Treatment: Rehabilitation  SUBJECTIVE:   SUBJECTIVE  STATEMENT: Doing okay, no new issues.    PERTINENT HISTORY:  Per Referring Physician note 05/05/23 67 y.o. Female presents today for right ear discomfort, vertigo since 04/18/23. Seen in ED on 04/18/23 for vertigo and given meclizine, diazepam. Meclizine did not give much relief. Describes inner ear tingling, vertigo that occurs when she is lying down and turns her head to the right. Declines post nasal drip. Declines muscle weakness. Has not done anything that would strain her neck. Taking Xanax nightly   PAIN:  Are you having pain? No  PRECAUTIONS: Fall  WEIGHT BEARING RESTRICTIONS: No  FALLS: Has patient fallen in last 6 months? No  LIVING ENVIRONMENT: Lives with: lives alone Lives in: House/apartment Has following equipment at home: None Occupation: [employer], 2 jobs involving using her computer.  PLOF: Independent  PATIENT GOALS: eliminate her dizziness  OBJECTIVE:   DIAGNOSTIC FINDINGS: N/A  COGNITION: Overall cognitive status: Within functional limits for tasks assessed   SENSATION: WFL  EDEMA:  Patient denies  MUSCLE TONE:  WNL  POSTURE:  rounded shoulders and increased thoracic kyphosis  Cervical ROM:  She reports WFL, but dizziness with R head rotation.   A/PROM (deg) eval  Flexion   Extension   Right lateral flexion   Left lateral flexion   Right rotation   Left rotation   (  Blank rows = not tested)  LOWER EXTREMITY MMT: deferred at evaluation  MMT Right eval Left eval  Hip flexion    Hip abduction    Hip adduction    Hip internal rotation    Hip external rotation    Knee flexion    Knee extension    Ankle dorsiflexion    Ankle plantarflexion    Ankle inversion    Ankle eversion    (Blank rows = not tested)  BED MOBILITY:  I TRANSFERS: I CURB: Complete Independence  GAIT: Gait pattern:  slow pace, no head turns/scanning, shuffling, and trunk flexed Distance walked: in clinic distances Assistive device utilized: None Level of  assistance: Complete Independence Comments: Patient very cautious during gait due to fear of triggering dizziness. She is self limiting her walking and all mobility due to fear of falling  VESTIBULAR ASSESSMENT:  GENERAL OBSERVATION:    SYMPTOM BEHAVIOR:  Subjective history: Patient reports that several weeks ago she woke up with an"electrical current" in her head. She then developed severe dizziness with R head turns. She was unable to get out of the bed due to the dizziness. She went to the hospital. The Dr noted some nystagmus. She started some meclizine which seemed to relieve the dizziness, but she awoke again on Thursday. She has taken her Meclizine today already. She reports previous episode of vertigo 30 years ago.  Non-Vestibular symptoms:  buzzing through entire R side of head, inconsistent.  Type of dizziness: Unsteady with head/body turns and "Funny feeling in the head"  Frequency: with R head turns, riding in car duirng U-turns  Duration: fleeting  Aggravating factors: Induced by position change: lying supine, rolling to the right, and supine to sit  Relieving factors: head stationary  Progression of symptoms: unchanged  OCULOMOTOR EXAM:  Ocular Alignment:   Ocular ROM:   Spontaneous Nystagmus:   Gaze-Induced Nystagmus:   Smooth Pursuits:   Saccades:   Convergence/Divergence:  cm   VESTIBULAR - OCULAR REFLEX:   Slow VOR:   VOR Cancellation:   Head-Impulse Test:   Dynamic Visual Acuity:    POSITIONAL TESTING: Right Dix-Hallpike: no nystagmus and dizziness Left Dix-Hallpike: no nystagmus  MOTION SENSITIVITY:  Motion Sensitivity Quotient Intensity: 0 = none, 1 = Lightheaded, 2 = Mild, 3 = Moderate, 4 = Severe, 5 = Vomiting  Intensity  1. Sitting to supine   2. Supine to L side   3. Supine to R side   4. Supine to sitting   5. L Hallpike-Dix   6. Up from L    7. R Hallpike-Dix   8. Up from R    9. Sitting, head tipped to L knee   10. Head up from L knee   11.  Sitting, head tipped to R knee   12. Head up from R knee   13. Sitting head turns x5   14.Sitting head nods x5   15. In stance, 180 turn to L    16. In stance, 180 turn to R     OTHOSTATICS:   FUNCTIONAL GAIT:    VESTIBULAR TREATMENT:  DATE:  05/27/23 NuStep L5 x62mins  VOR on airex x1 and x2 horizontal and vertical  Step ups on airex Side steps on airex  SLS on airex 10s  Tandem on airex 30s  Cone tap on airex 20 taps alt  Walking on beam On beam tandem EC 10s, then SL 5s  Side steps on beam catching ball Walking catching ball with direction changes   05/20/23 Reassess Gilberto Better, Epley on R side- clear  VOR exercises  NuStep L5 x95mins Standing on airex, then EC On airex catching ball  Walking with eyes on target forwards and backwards with head turns   05/14/23 Education  Canalith Repositioning:  Epley Right: Number of Reps: 1 and Response to Treatment: symptoms improved Gaze Adaptation:   Habituation:   Other:   PATIENT EDUCATION: Education details: POC, BPPV causes and treatment Person educated: Patient Education method: Explanation Education comprehension: verbalized understanding  HOME EXERCISE PROGRAM: Access Code: [email or web address] URL: [email or web address] Date: 05/20/2023 Prepared by: Cassie Freer  Exercises - Standing Gaze Stabilization with Head Rotation  - 1 x daily - 7 x weekly - 2 sets - 10 reps - Standing Gaze Stabilization with Head Nod  - 1 x daily - 7 x weekly - 2 sets - 10 reps - Standing VOR Cancellation  - 1 x daily - 7 x weekly - 2 sets - 10 reps - Standing Gaze Stabilization with Two Near Targets and Head Rotation  - 1 x daily - 7 x weekly - 2 sets - 10 reps  GOALS: Goals reviewed with patient? Yes  SHORT TERM GOALS: Target date: 05/28/23  I with final HEP Baseline: given 05/20/23 Goal status: MET  LONG TERM GOALS:  Target date: 07/23/23  I with final HEP Baseline:  Goal status: MET  2.  Patient will report resolution of her dizziness with head turns and while riding in the car. Baseline:  Goal status: MET  3.  Patient will report no dizziness in a 4 week period  Baseline:  Goal status: MET    ASSESSMENT:  CLINICAL IMPRESSION: Patient returns with no dizziness. She reports  she has stopped taking the Antivert for a few days and has not experienced any BPPV symptoms. We focused on some harder VOR and balance related exercises. She does really well with it and states she is ready to discharge and return to work.    OBJECTIVE IMPAIRMENTS: Abnormal gait, decreased balance, and dizziness.   ACTIVITY LIMITATIONS: bending, sitting, standing, sleeping, and locomotion level  PARTICIPATION LIMITATIONS: cleaning, driving, shopping, community activity, and occupation  PERSONAL FACTORS: Past/current experiences are also affecting patient's functional outcome.   REHAB POTENTIAL: Good  CLINICAL DECISION MAKING: Evolving/moderate complexity  EVALUATION COMPLEXITY: Moderate   PLAN:  PT FREQUENCY: 1-2x/week  PT DURATION: 10 weeks  PLANNED INTERVENTIONS: Therapeutic exercises, Therapeutic activity, Neuromuscular re-education, Balance training, Gait training, Patient/Family education, Self Care, Joint mobilization, Vestibular training, Canalith repositioning, and Visual/preceptual remediation/compensation  PLAN FOR NEXT SESSION: d/c   PHYSICAL THERAPY DISCHARGE SUMMARY  Visits from Start of Care: 3  Patient agrees to discharge. Patient goals were met. Patient is being discharged due to being pleased with the current functional level.     Tigard, Stella 05/27/2023, 5:12 PM

## 2023-05-27 ENCOUNTER — Ambulatory Visit: Payer: Medicare Other

## 2023-05-27 DIAGNOSIS — M6281 Muscle weakness (generalized): Secondary | ICD-10-CM

## 2023-05-27 DIAGNOSIS — R262 Difficulty in walking, not elsewhere classified: Secondary | ICD-10-CM

## 2023-05-27 DIAGNOSIS — R278 Other lack of coordination: Secondary | ICD-10-CM

## 2023-05-27 DIAGNOSIS — R2681 Unsteadiness on feet: Secondary | ICD-10-CM

## 2023-05-27 DIAGNOSIS — R42 Dizziness and giddiness: Secondary | ICD-10-CM

## 2023-05-30 ENCOUNTER — Ambulatory Visit: Payer: Medicare Other | Admitting: Physical Therapy

## 2023-06-03 ENCOUNTER — Ambulatory Visit: Payer: Medicare Other | Admitting: Physical Therapy

## 2023-06-06 ENCOUNTER — Ambulatory Visit (HOSPITAL_COMMUNITY): Payer: Medicare Other | Attending: Cardiology

## 2023-06-06 DIAGNOSIS — R072 Precordial pain: Secondary | ICD-10-CM

## 2023-06-06 LAB — ECHOCARDIOGRAM COMPLETE
Area-P 1/2: 3.08 cm2
S' Lateral: 1.9 cm
Single Plane A2C EF: 63.1 %
Single Plane A4C EF: 59 %

## 2023-06-18 ENCOUNTER — Encounter (HOSPITAL_COMMUNITY): Payer: Self-pay

## 2023-06-20 ENCOUNTER — Ambulatory Visit (HOSPITAL_COMMUNITY): Payer: Medicare Other | Attending: Cardiovascular Disease

## 2023-06-20 DIAGNOSIS — R072 Precordial pain: Secondary | ICD-10-CM | POA: Diagnosis not present

## 2023-06-20 LAB — MYOCARDIAL PERFUSION IMAGING
Angina Index: 0
Duke Treadmill Score: 6
Estimated workload: 6
Exercise duration (min): 6 min
Exercise duration (sec): 0 s
LV dias vol: 38 mL (ref 46–106)
LV sys vol: 10 mL
MPHR: 154 {beats}/min
Nuc Stress EF: 74 %
Peak HR: 144 {beats}/min
Percent HR: 93 %
Rest HR: 83 {beats}/min
Rest Nuclear Isotope Dose: 10.1 mCi
SDS: 5
SRS: 1
SSS: 6
ST Depression (mm): 0 mm
Stress Nuclear Isotope Dose: 29.7 mCi
TID: 1.14

## 2023-06-20 MED ORDER — TECHNETIUM TC 99M TETROFOSMIN IV KIT
29.7000 | PACK | Freq: Once | INTRAVENOUS | Status: AC | PRN
  Administered 2023-06-20: 29.7 via INTRAVENOUS

## 2023-06-20 MED ORDER — TECHNETIUM TC 99M TETROFOSMIN IV KIT
10.1000 | PACK | Freq: Once | INTRAVENOUS | Status: AC | PRN
  Administered 2023-06-20: 10.1 via INTRAVENOUS

## 2023-09-14 NOTE — Progress Notes (Unsigned)
Cardiology Office Note:    Date:  09/16/2023   ID:  [name], DOB Jan 19, 1956, MRN [id number]  PCP:  Margaree Mackintosh, MD  Cardiologist:  None  Electrophysiologist:  None   Referring MD: Margaree Mackintosh, MD   Chief Complaint  Patient presents with   Coronary Artery Disease    History of Present Illness:    Paige Lawrence is a 67 y.o. female with a hx of GERD, hiatal hernia, spinal stenosis, MVP who presents for follow-up.  She was referred by Dr. Lenord Fellers for evaluation of chest pain, initially seen 05/09/2023.  Calcium score on 04/03/2023 was 29 (66 percentile).  She reports he started having left lower chest pain about 2 months ago, has been occurring daily though none in the last few weeks.  Describes feeling something moving inside of her chest.  Particular notices it with standing.  She walks daily but has not been walking for the last 3 weeks due to having vertigo issues.  She denies any dyspnea, lightheadedness, syncope, lower extremity edema, or palpitations.  She was told that she had mitral valve prolapse in 1980s.  She has smoked for 45 years, currently smoking 0.5 packs/day.  Family history includes half brother had MI in early 26s.  Echocardiogram 06/2023 showed EF 60 to 65%, normal RV function, myxomatous mitral valve with prolapse but no significant MR.  Exercise Myoview 06/2023 showed normal perfusion, EF 74%.  Since last clinic visit, she reports he is doing well.  Denies any chest pain, dyspnea, lightheadedness, syncope, lower extremity edema, or palpitations.  Continues to smoke, about 0.5 packs/day.    Past Medical History:  Diagnosis Date   Aortic atherosclerosis (HCC)    Cholelithiasis    Colon polyps    DDD (degenerative disc disease), lumbar    Diverticulosis    GERD (gastroesophageal reflux disease)    Hemorrhoids    Herniated disc, cervical    c6-7   Hiatal hernia    Hx of spinal stenosis    Hypoglycemic reaction    Internal hemorrhoids    Mitral  valve prolapse    Osteopenia    -1.7 2/05; -1.4 3/07; -2.2 7/11   Uterine fibroid    Vitamin D deficiency    26 4/10; 28 5/11    Past Surgical History:  Procedure Laterality Date   CESAREAN SECTION     COLONOSCOPY  11/16/2020   WISDOM TOOTH EXTRACTION      Current Medications: Current Meds  Medication Sig   alendronate (FOSAMAX) 70 MG tablet Take 1 tablet (70 mg total) by mouth every 7 (seven) days. Take with a full glass of water on an empty stomach.   ALPRAZolam (XANAX) 0.5 MG tablet One tab by mouth up to 3 times daily as needed for acute vertigo   cyclobenzaprine (FLEXERIL) 10 MG tablet Take 1 tablet (10 mg total) by mouth at bedtime.   ergocalciferol (DRISDOL) 1.25 MG (50000 UT) capsule Take 1 capsule (50,000 Units total) by mouth once a week.   meclizine (ANTIVERT) 25 MG tablet Take 1 tablet (25 mg total) by mouth 3 (three) times daily as needed for dizziness.   meloxicam (MOBIC) 15 MG tablet Take 1 tablet (15 mg total) by mouth daily.   nicotine polacrilex (NICORETTE) 4 MG gum Take 1 each (4 mg total) by mouth as needed for smoking cessation.   pantoprazole (PROTONIX) 40 MG tablet Take 1 tablet (40 mg total) by mouth daily.   polyethylene glycol (MIRALAX / GLYCOLAX)  17 g packet Take 17 g by mouth daily.   triamcinolone cream (KENALOG) 0.1 % Apply to insect bites 3 times a day for 5- 7 days     Allergies:   Aspirin, Ibuprofen, Ivp dye [iodinated contrast media], Sulfa antibiotics, and Ciprofloxacin hcl   Social History   Socioeconomic History   Marital status: Divorced    Spouse name: Not on file   Number of children: Not on file   Years of education: Not on file   Highest education level: Not on file  Occupational History   Not on file  Tobacco Use   Smoking status: Every Day    Current packs/day: 0.50    Types: Cigarettes   Smokeless tobacco: Never  Vaping Use   Vaping status: Never Used  Substance and Sexual Activity   Alcohol use: Not Currently   Drug use:  No   Sexual activity: Not Currently    Birth control/protection: Post-menopausal    Comment: 1st intercourse 4 yo-5 partners  Other Topics Concern   Not on file  Social History Narrative   Not on file   Social Determinants of Health   Financial Resource Strain: Low Risk  (08/05/2020)   Received from [facility], [facility]   Overall Financial Resource Strain (CARDIA)    Difficulty of Paying Living Expenses: Not hard at all  Food Insecurity: Not on file  Transportation Needs: Not on file  Physical Activity: Not on file  Stress: No Stress Concern Present (08/05/2020)   Received from [facility], [facility]   [employer] [facility]ational [facility] - Occupational Stress Questionnaire    Feeling of Stress : Only a little  Social Connections: Unknown (03/19/2022)   Received from [facility], [facility]   Social Network    Social Network: Not on file     Family History: The patient's family history includes Breast cancer in her cousin; Cancer in her maternal aunt and son; Colon cancer in her maternal aunt; Colon polyps in her father; Diabetes in her paternal aunt; Heart disease in her brother; Hypertension in her mother.  ROS:   Please see the history of present illness.     All other systems reviewed and are negative.  EKGs/Labs/Other Studies Reviewed:    The following studies were reviewed today:   EKG:   03/14/23: NSR, rate 76, LVH  Recent Labs: 03/14/2023: ALT 19; BUN 9; Creat 0.75; Hemoglobin 17.8; Platelets 253; Potassium 4.9; Sodium 143; TSH 2.29  Recent Lipid Panel    Component Value Date/Time   CHOL 177 09/16/2022 1001   TRIG 51 09/16/2022 1001   HDL 71 09/16/2022 1001   CHOLHDL 2.5 09/16/2022 1001   LDLCALC 93 09/16/2022 1001    Physical Exam:    VS:  BP 130/68 (BP Location: Left Arm, Patient Position: Sitting, Cuff Size: Normal)   Pulse 86   Ht 5\' 3"  (1.6 m)   Wt 93 lb 6.4 oz (42.4 kg)   LMP 11/04/1996   SpO2 97%   BMI 16.55 kg/m      Wt Readings from Last 3 Encounters:  09/16/23 93 lb 6.4 oz (42.4 kg)  05/09/23 92 lb 4.8 oz (41.9 kg)  05/05/23 97 lb 4 oz (44.1 kg)     GEN:  Well nourished, well developed in no acute distress HEENT: Normal NECK: No JVD; No carotid bruits LYMPHATICS: No lymphadenopathy CARDIAC: RRR, no murmurs, rubs, gallops RESPIRATORY:  Clear to auscultation without rales, wheezing or rhonchi  ABDOMEN: Soft, non-tender, non-distended  MUSCULOSKELETAL:  No edema; No deformity  SKIN: Warm and dry NEUROLOGIC:  Alert and oriented x 3 PSYCHIATRIC:  Normal affect   ASSESSMENT:    1. Coronary artery disease involving native coronary artery of native heart, unspecified whether angina present   2. Mitral valve prolapse   3. Tobacco use   4. Hyperlipidemia, unspecified hyperlipidemia type     PLAN:    Mitral valve prolapse:  Echocardiogram 06/2023 showed EF 60 to 65%, normal RV function, myxomatous mitral valve with prolapse but no significant MR.  CAD: Calcium score on 04/03/2023 was 29 (66 percentile).  Reported atypical chest pain, exercise Myoview 06/2023 showed normal perfusion, EF 74%. -Started atorvastatin 20 mg daily, check lipid panel  Tobacco use: Has smoked 0.5 packs/day x 45 years.  Counseled on risk of tobacco use and cessation strongly encouraged.  She would like to try nicotine gum to aid in cessation, will prescribe  RTC in 1 year   Medication Adjustments/Labs and Tests Ordered: Current medicines are reviewed at length with the patient today.  Concerns regarding medicines are outlined above.  Orders Placed This Encounter  Procedures   Lipid panel   Meds ordered this encounter  Medications   nicotine polacrilex (NICORETTE) 4 MG gum    Sig: Take 1 each (4 mg total) by mouth as needed for smoking cessation.    Dispense:  100 tablet    Refill:  3    Patient Instructions  Medication Instructions:  Continue current medications Start Nicorette  gum as ordered *If you need a  refill on your cardiac medications before your next appointment, please call your pharmacy*   Lab Work: Lipid panel today If you have labs (blood work) drawn today and your tests are completely normal, you will receive your results only by: MyChart Message (if you have MyChart) OR A paper copy in the mail If you have any lab test that is abnormal or we need to change your treatment, we will call you to review the results.   Testing/Procedures: None    Follow-Up: At [facility], you and your health needs are our priority.  As part of our continuing mission to provide you with exceptional heart care, we have created designated Provider Care Teams.  These Care Teams include your primary Cardiologist (physician) and Advanced Practice Providers (APPs -  Physician Assistants and Nurse Practitioners) who all work together to provide you with the care you need, when you need it.  We recommend signing up for the patient portal called "MyChart".  Sign up information is provided on this After Visit Summary.  MyChart is used to connect with patients for Virtual Visits (Telemedicine).  Patients are able to view lab/test results, encounter notes, upcoming appointments, etc.  Non-urgent messages can be sent to your provider as well.   To learn more about what you can do with MyChart, go to [email or web address].    Your next appointment:   1 year(s)  Provider:   Dr. Bjorn Pippin      Signed, Little Ishikawa, MD  09/16/2023 3:31 PM    [facility]

## 2023-09-16 ENCOUNTER — Encounter: Payer: Self-pay | Admitting: Cardiology

## 2023-09-16 ENCOUNTER — Ambulatory Visit: Payer: Medicare Other | Attending: Cardiology | Admitting: Cardiology

## 2023-09-16 ENCOUNTER — Telehealth: Payer: Self-pay | Admitting: Cardiology

## 2023-09-16 VITALS — BP 130/68 | HR 86 | Ht 63.0 in | Wt 93.4 lb

## 2023-09-16 DIAGNOSIS — I2510 Atherosclerotic heart disease of native coronary artery without angina pectoris: Secondary | ICD-10-CM

## 2023-09-16 DIAGNOSIS — E785 Hyperlipidemia, unspecified: Secondary | ICD-10-CM | POA: Diagnosis not present

## 2023-09-16 DIAGNOSIS — Z720 Tobacco use: Secondary | ICD-10-CM

## 2023-09-16 DIAGNOSIS — I341 Nonrheumatic mitral (valve) prolapse: Secondary | ICD-10-CM

## 2023-09-16 MED ORDER — NICOTINE POLACRILEX 4 MG MT GUM: 4.0000 mg | CHEWING_GUM | OROMUCOSAL | 3 refills | Status: AC | PRN

## 2023-09-16 NOTE — Patient Instructions (Signed)
Medication Instructions:  Continue current medications Start Nicorette  gum as ordered *If you need a refill on your cardiac medications before your next appointment, please call your pharmacy*   Lab Work: Lipid panel today If you have labs (blood work) drawn today and your tests are completely normal, you will receive your results only by: MyChart Message (if you have MyChart) OR A paper copy in the mail If you have any lab test that is abnormal or we need to change your treatment, we will call you to review the results.   Testing/Procedures: None    Follow-Up: At [facility], you and your health needs are our priority.  As part of our continuing mission to provide you with exceptional heart care, we have created designated Provider Care Teams.  These Care Teams include your primary Cardiologist (physician) and Advanced Practice Providers (APPs -  Physician Assistants and Nurse Practitioners) who all work together to provide you with the care you need, when you need it.  We recommend signing up for the patient portal called "MyChart".  Sign up information is provided on this After Visit Summary.  MyChart is used to connect with patients for Virtual Visits (Telemedicine).  Patients are able to view lab/test results, encounter notes, upcoming appointments, etc.  Non-urgent messages can be sent to your provider as well.   To learn more about what you can do with MyChart, go to [email or web address].    Your next appointment:   1 year(s)  Provider:   Dr. Bjorn Pippin

## 2023-09-16 NOTE — Telephone Encounter (Signed)
Pt c/o medication issue:  1. Name of Medication:   nicotine polacrilex (NICORETTE) 4 MG gum   2. How are you currently taking this medication (dosage and times per day)?   Not started yet  3. Are you having a reaction (difficulty breathing--STAT)?     4. What is your medication issue?   Caller Fayrene Fearing) stated they will need to know the maximum daily dose for this medication.

## 2023-09-16 NOTE — Telephone Encounter (Signed)
[facility] pharmacy and spoke to [place] to inform him per Laural Golden the pharmacist patient can have 24 max daily.

## 2023-09-17 LAB — LIPID PANEL
Chol/HDL Ratio: 2.1 ratio (ref 0.0–4.4)
Cholesterol, Total: 167 mg/dL (ref 100–199)
HDL: 80 mg/dL (ref >39)
LDL Chol Calc (NIH): 71 mg/dL (ref 0–99)
Triglycerides: 86 mg/dL (ref 0–149)
VLDL Cholesterol Cal: 16 mg/dL (ref 5–40)

## 2023-09-19 ENCOUNTER — Other Ambulatory Visit: Payer: Self-pay | Admitting: Internal Medicine

## 2023-09-19 ENCOUNTER — Other Ambulatory Visit: Payer: Medicare Other

## 2023-09-19 DIAGNOSIS — M810 Age-related osteoporosis without current pathological fracture: Secondary | ICD-10-CM

## 2023-09-19 DIAGNOSIS — Z13220 Encounter for screening for lipoid disorders: Secondary | ICD-10-CM

## 2023-09-19 DIAGNOSIS — Z1231 Encounter for screening mammogram for malignant neoplasm of breast: Secondary | ICD-10-CM

## 2023-09-19 DIAGNOSIS — M8580 Other specified disorders of bone density and structure, unspecified site: Secondary | ICD-10-CM

## 2023-09-20 LAB — TSH: TSH: 2.56 m[IU]/L (ref 0.40–4.50)

## 2023-09-26 ENCOUNTER — Ambulatory Visit (INDEPENDENT_AMBULATORY_CARE_PROVIDER_SITE_OTHER): Payer: Medicare Other | Admitting: Internal Medicine

## 2023-09-26 ENCOUNTER — Encounter: Payer: Self-pay | Admitting: Internal Medicine

## 2023-09-26 VITALS — BP 120/80 | HR 70 | Ht 63.0 in | Wt 99.0 lb

## 2023-09-26 DIAGNOSIS — F419 Anxiety disorder, unspecified: Secondary | ICD-10-CM | POA: Diagnosis not present

## 2023-09-26 DIAGNOSIS — H8110 Benign paroxysmal vertigo, unspecified ear: Secondary | ICD-10-CM | POA: Diagnosis not present

## 2023-09-26 DIAGNOSIS — M810 Age-related osteoporosis without current pathological fracture: Secondary | ICD-10-CM

## 2023-09-26 DIAGNOSIS — N398 Other specified disorders of urinary system: Secondary | ICD-10-CM

## 2023-09-26 DIAGNOSIS — Z0000 Encounter for general adult medical examination without abnormal findings: Secondary | ICD-10-CM | POA: Diagnosis not present

## 2023-09-26 DIAGNOSIS — F17200 Nicotine dependence, unspecified, uncomplicated: Secondary | ICD-10-CM

## 2023-09-26 DIAGNOSIS — K219 Gastro-esophageal reflux disease without esophagitis: Secondary | ICD-10-CM

## 2023-09-26 NOTE — Progress Notes (Addendum)
Annual Wellness Visit    Patient Care Team: Margaree Mackintosh, MD as PCP - General (Internal Medicine)  Visit Date: 09/26/23   Chief Complaint  Patient presents with   Medicare Wellness   Annual Exam    Subjective:   Patient: Paige Lawrence, Female    DOB: 24-Jun-1956, 67 y.o.   MRN: [id number]      Paige Lawrence is a 67 y.o. Female who presents today for her Annual Wellness Visit.  Today, she generally appears well. For exercise she has been trying to walk every day. Reviewed recent lab results with the patient. Her thyroid and cholesterol labs are stable.  History of vertigo. Treated with alprazolam 0.5 mg TID as needed. At our visit 05/05/2023 she was referred to vestibular rehab for Epley maneuver. She confirms completing her PT which was helpful.   History of CAD. She was seen by her cardiologist Dr. Epifanio Lesches 09/16/2023. At that visit she was doing well from a cardiovascular perspective. She was prescribed nicotine gum to aid in tobacco cessation. Initially seen by him 05/09/23 for evaluation of chest pain. She had a calcium score of 29 (66th percentile) 03/2023. Echo 06/2023 with LVEF 60-65%, normal RV function, myxomatous mitral valve with prolapse but no significant MR.   She has a history of voiding dysfunction that started with hospitalization for diverticulutis. Is followed by Dr. Cardell Peach at [facility] and does self cath about 4 times a day.   Immunizations reviewed:  She is overdue for all recommended immunizations. She declines receiving any vaccinations today. She may consider revisiting this after Christmas.  Health maintenance: She has an upcoming mammogram scheduled for 10/20/2023. Mammogram last completed 08/23/2022 which was normal. Patient had GYN appointment in February 2023 with Nurse practitioner.  Also saw Dr. Seymour Bars for osteoporosis in March 2023.  T-score was -3.1 at that time.   Patient was seen by nurse practitioner at [facility] in October  2023 with left lower quadrant pain. Patient was concerned about diverticulitis. White count was normal and she was prescribed Augmentin. She has been prescribed MiraLAX in the past. Admitted to the hospital at [facility] in October 2021 with a small bowel obstruction. Had colonoscopy in 2022 and was identified as having severe diverticulosis in the mid and distal sigmoid colon. 2 polyps removed one was a tubular adenoma and one was a lymphoid aggregate polyp. Repeat colonoscopy advised in 7 years.   Past medical history: History of anxiety disorder.  History of fractured clavicle in the remote past.  C-section 1980.   Intolerant of sulfa and ibuprofen.  Family history: Father with history of dementia.   Social history: Patient continues to smoke.  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  On June 14, 2020 she presented to [facility] emergency department with abdominal pain.  Had been seen previously by nurse practitioner at GYN office the previous day.  Urine dipstick was unremarkable and nurse practitioner felt patient had diverticulitis.  White count was 13,600.  She has small LE on urine dipstick.  CT of the abdomen and pelvis showed highly inflamed mid sigmoid colon.  Was felt to have acute diverticulitis without perforation or abscess.  Has severely distended urinary bladder with mild wall thickening.  Was felt to have urinary retention secondary to diverticulitis.  Also was found to have cholelithiasis and aortic atherosclerosis.  Patient was treated with Zofran, Norco for pain, Cipro and Flagyl and was  discharged home.  On August 13, she called GYN office indicating metronidazole was not agreeing with her and she noticed itching on back and chest.  GYN office felt she was allergic to Cipro and recommended discontinuing Cipro and continuing metronidazole.  Was seen back in the emergency department at [facility] on August 14  and rash is spread to abdomen, chest and legs.  She took Xyzal but continued to have rash and itching.  It was felt she had an allergic reaction to Cipro.  She was placed on Augmentin instead.  She subsequently went to [facility] in [place] July 30, 2020 with complaint of cystitis and had white blood cell count of 15,300.  Urine specimen was positive for LE but negative for nitrite.  Ultrasound of the gallbladder showed a 6 mm gallstone.  Had no evidence of diverticulitis on CT.  It was felt that she has allergy to IV contrast media.  She was confirmed to have a UTI during this hospitalization.  She had urinary retention.  Was treated with IV Rocephin.  She was unwilling to do In-N-Out cath as an outpatient and was sent home only Foley catheter and subsequently referred to urology.  She continues to do regular IandO cath about 4 times a day.  Underwent pelvic floor physical therapy but remains on intermittent catheterization.  Was felt to have a UTI and was treated with cephalexin twice daily for 7 days.    Past Medical History:  Diagnosis Date   Aortic atherosclerosis (HCC)    Cholelithiasis    Colon polyps    DDD (degenerative disc disease), lumbar    Diverticulosis    GERD (gastroesophageal reflux disease)    Hemorrhoids    Herniated disc, cervical    c6-7   Hiatal hernia    Hx of spinal stenosis    Hypoglycemic reaction    Internal hemorrhoids    Mitral valve prolapse    Osteopenia    -1.7 2/05; -1.4 3/07; -2.2 7/11   Uterine fibroid    Vitamin D deficiency    26 4/10; 28 5/11     Family History  Problem Relation Age of Onset   Cancer Maternal Aunt        colon   Colon cancer Maternal Aunt    Hypertension Mother    Colon polyps Father    Heart disease Brother    Diabetes Paternal Aunt    Breast cancer Cousin        maternal cousin   Cancer Son        Appendix         Review of Systems  Constitutional:  Negative for fever and malaise/fatigue.  HENT:   Negative for congestion.   Eyes:  Negative for blurred vision.  Respiratory:  Negative for cough and shortness of breath.   Cardiovascular:  Negative for chest pain, palpitations and leg swelling.  Gastrointestinal:  Negative for vomiting.  Musculoskeletal:  Negative for back pain.  Skin:  Negative for rash.  Neurological:  Negative for loss of consciousness and headaches.      Objective:   Vitals: BP 120/80   Pulse 70   Ht 5\' 3"  (1.6 m)   Wt 99 lb (44.9 kg)   LMP 11/04/1996   SpO2 98%   BMI 17.54 kg/m   Physical Exam Vitals and nursing note reviewed.  Constitutional:      General: She is not in acute distress.    Appearance: Normal appearance. She is not  ill-appearing or toxic-appearing.  HENT:     Head: Normocephalic and atraumatic.     Right Ear: Hearing, tympanic membrane, ear canal and external ear normal.     Left Ear: Hearing, tympanic membrane, ear canal and external ear normal.     Mouth/Throat:     Pharynx: Oropharynx is clear.  Eyes:     Extraocular Movements: Extraocular movements intact.     Pupils: Pupils are equal, round, and reactive to light.  Neck:     Thyroid: No thyroid mass, thyromegaly or thyroid tenderness.     Vascular: No carotid bruit.  Cardiovascular:     Rate and Rhythm: Normal rate and regular rhythm. No extrasystoles are present.    Pulses:          Dorsalis pedis pulses are 1+ on the right side and 1+ on the left side.     Heart sounds: Normal heart sounds. No murmur heard.    No friction rub. No gallop.  Pulmonary:     Effort: Pulmonary effort is normal.     Breath sounds: Normal breath sounds. No decreased breath sounds, wheezing, rhonchi or rales.  Chest:     Chest wall: No mass.  Abdominal:     General: Bowel sounds are normal.     Palpations: Abdomen is soft. There is no hepatomegaly, splenomegaly or mass.     Tenderness: There is no abdominal tenderness.     Hernia: No hernia is present.  Musculoskeletal:     Cervical back:  Normal range of motion.     Right lower leg: No edema.     Left lower leg: No edema.  Lymphadenopathy:     Cervical: No cervical adenopathy.     Upper Body:     Right upper body: No supraclavicular adenopathy.     Left upper body: No supraclavicular adenopathy.  Skin:    General: Skin is warm and dry.     Comments: Multiple sebaceous cysts noted on back: one located in right peri-lumbar area, and one located slightly right of thoracic area.   Neurological:     General: No focal deficit present.     Mental Status: She is alert and oriented to person, place, and time. Mental status is at baseline.     Sensory: Sensation is intact.     Motor: Motor function is intact. No weakness.     Deep Tendon Reflexes: Reflexes are normal and symmetric.  Psychiatric:        Attention and Perception: Attention normal.        Mood and Affect: Mood normal.        Speech: Speech normal.        Behavior: Behavior normal.        Thought Content: Thought content normal.        Cognition and Memory: Cognition normal.        Judgment: Judgment normal.      Most recent functional status assessment:    09/26/2023    3:08 PM  In your present state of health, do you have any difficulty performing the following activities:  Hearing? 0  Vision? 0  Difficulty concentrating or making decisions? 0  Walking or climbing stairs? 0  Dressing or bathing? 0  Doing errands, shopping? 0  Preparing Food and eating ? N  Using the Toilet? N  In the past six months, have you accidently leaked urine? N  Do you have problems with loss of bowel control? N  Managing your  Medications? N  Managing your Finances? N  Housekeeping or managing your Housekeeping? N   Most recent fall risk assessment:    09/26/2023    3:09 PM  Fall Risk   Falls in the past year? 0  Number falls in past yr: 0  Injury with Fall? 0  Risk for fall due to : No Fall Risks  Follow up Falls prevention discussed;Education provided;Falls  evaluation completed    Most recent depression screenings:    09/26/2023    3:09 PM 03/14/2023    9:38 AM  PHQ 2/9 Scores  PHQ - 2 Score 0 0   Most recent cognitive screening:    09/26/2023    3:11 PM  6CIT Screen  What Year? 0 points  What month? 0 points  What time? 0 points  Count back from 20 0 points  Months in reverse 0 points  Repeat phrase 0 points  Total Score 0 points     Results:   Studies obtained and personally reviewed by me:  She has an upcoming mammogram scheduled for 10/20/2023. Mammogram last completed 08/23/2022 which was normal.  She had a colonoscopy 11/16/2020 which was notable for severe diverticulosis confined to the mid and distal sigmoid colon, as well as internal hemorrhoids. Repeat colonoscopy next due on 11/17/2027.  Exercise Myoview  06/20/2023:   LV perfusion is normal. There is no evidence of ischemia. There is no evidence of infarction.   Left ventricular function is normal. Nuclear stress EF: 74%. The left ventricular ejection fraction is hyperdynamic (>65%). End diastolic cavity size is normal.   Average exercise capacity (6:00 min:s; 3.5 METs).   The study is normal. The study is low risk.  Echo  06/06/2023:  1. Left ventricular ejection fraction, by estimation, is 60 to 65%. The  left ventricle has normal function. The left ventricle has no regional  wall motion abnormalities. Left ventricular diastolic parameters are  indeterminate.   2. Right ventricular systolic function is normal. The right ventricular  size is normal. There is normal pulmonary artery systolic pressure.   3. The mitral valve is myxomatous. No evidence of mitral valve  regurgitation. No evidence of mitral stenosis. There is holosystolic  prolapse of the middle segment of the anterior leaflet of the mitral  valve.   4. The aortic valve is tricuspid. Aortic valve regurgitation is not  visualized. No aortic stenosis is present.   5. The inferior vena cava is normal in  size with greater than 50%  respiratory variability, suggesting right atrial pressure of 3 mmHg.   CT Cardiac Scoring  04/03/2023: IMPRESSION: Coronary calcium score of 29.2. This was 22 percentile for age-, race-, and sex-matched controls.  Labs:       Component Value Date/Time   NA 143 03/14/2023 1010   K 4.9 03/14/2023 1010   CL 104 03/14/2023 1010   CO2 27 03/14/2023 1010   GLUCOSE 76 03/14/2023 1010   BUN 9 03/14/2023 1010   CREATININE 0.75 03/14/2023 1010   CALCIUM 10.7 (H) 03/14/2023 1010   PROT 8.0 03/14/2023 1010   ALBUMIN 4.4 08/20/2022 1311   AST 27 03/14/2023 1010   ALT 19 03/14/2023 1010   ALKPHOS 94 08/20/2022 1311   BILITOT 1.0 03/14/2023 1010   GFRNONAA 86 08/21/2020 0948   GFRAA 100 08/21/2020 0948     Lab Results  Component Value Date   WBC 7.8 03/14/2023   HGB 17.8 (H) 03/14/2023   HCT 51.4 (H) 03/14/2023   MCV 101.4 (  H) 03/14/2023   PLT 253 03/14/2023    Lab Results  Component Value Date   CHOL 167 09/16/2023   HDL 80 09/16/2023   LDLCALC 71 09/16/2023   TRIG 86 09/16/2023   CHOLHDL 2.1 09/16/2023    No results found for: "HGBA1C"   Lab Results  Component Value Date   TSH 2.56 09/19/2023       Assessment & Plan:   CAD - treated with atorvastatin 20 mg daily. Coronary calcium score 29 in 03/2023.  History of vertigo - At our last visit 05/05/2023 she was referred to vestibular rehab for Epley maneuver, and refilled alprazolam 0.5 mg three times daily as needed for vertigo. She confirms she completed her PT which was helpful.  Tobacco use - Currently smoking. Has been advised in the past to quit smoking  Immunizations reviewed:  She is overdue for all recommended immunizations. She declines receiving any vaccinations today.  Health maintenance: She has an upcoming mammogram scheduled for 10/20/2023. Mammogram last completed 08/23/2022 which was normal. Had colonoscopy in 2022 and was identified as having severe diverticulosis in the mid  and distal sigmoid colon. 2 polyps removed - one was a tubular adenoma and one was a lymphoid aggregate polyp. Repeat colonoscopy advised in 7 years.   History of osteoporosis - treated by Dr. Seymour Bars. We revisited discussion of starting Prolia, which she will consider.  Voiding dysfunction: still has to do I and O caths. This is a result of hospitalization for diverticulitis in the recent past  History of diverticulitis and small bowel obstruction in October 2021. History of urinary retention at that time as well and was treated with I&O self catheterizations which she continues to do about 4 times daily.  She is followed at [facility] by Dr. Cardell Peach.    Plan:  Labs reviewed and are within normal limits. Continue current medications as prescribed.Return in one year or as needed. No change in meds. Advise smoking cessation       Annual wellness visit done today including the all of the following: Reviewed patient's Family Medical History Reviewed and updated list of patient's medical providers Assessment of cognitive impairment was done Assessed patient's functional ability Established a written schedule for health screening services Health Risk Assessent Completed and Reviewed  Discussed health benefits of physical activity, and encouraged her to engage in regular exercise appropriate for her age and condition.      I,Mathew Stumpf,acting as a Neurosurgeon for Margaree Mackintosh, MD.,have documented all relevant documentation on the behalf of Margaree Mackintosh, MD,as directed by  Margaree Mackintosh, MD while in the presence of Margaree Mackintosh, MD. I, Margaree Mackintosh, MD, have reviewed all documentation for this visit. The documentation on 09/26/23 for the exam, diagnosis, procedures, and orders are all accurate and complete.

## 2023-10-20 ENCOUNTER — Ambulatory Visit
Admission: RE | Admit: 2023-10-20 | Discharge: 2023-10-20 | Disposition: A | Discharge status: Home / Self Care | Payer: Medicare Other | Source: Ambulatory Visit | Attending: Internal Medicine | Admitting: Internal Medicine

## 2023-10-20 DIAGNOSIS — Z1231 Encounter for screening mammogram for malignant neoplasm of breast: Secondary | ICD-10-CM

## 2023-11-06 ENCOUNTER — Ambulatory Visit: Payer: Medicare Other | Admitting: Internal Medicine

## 2023-11-06 ENCOUNTER — Telehealth: Payer: Self-pay | Admitting: Internal Medicine

## 2023-11-06 VITALS — BP 100/60 | HR 99 | Temp 97.6°F | Ht 63.0 in

## 2023-11-06 DIAGNOSIS — J0100 Acute maxillary sinusitis, unspecified: Secondary | ICD-10-CM | POA: Diagnosis not present

## 2023-11-06 DIAGNOSIS — R059 Cough, unspecified: Secondary | ICD-10-CM

## 2023-11-06 DIAGNOSIS — R110 Nausea: Secondary | ICD-10-CM

## 2023-11-06 DIAGNOSIS — R0981 Nasal congestion: Secondary | ICD-10-CM

## 2023-11-06 LAB — POCT FLU A/B STATUS
Influenza A, POC: NEGATIVE
Influenza B, POC: NEGATIVE

## 2023-11-06 MED ORDER — HYDROCODONE BIT-HOMATROP MBR 5-1.5 MG/5ML PO SOLN: 5.0000 mL | Freq: Three times a day (TID) | ORAL | 0 refills | Status: DC | PRN

## 2023-11-06 MED ORDER — AMOXICILLIN-POT CLAVULANATE 875-125 MG PO TABS: 1.0000 | ORAL_TABLET | Freq: Two times a day (BID) | ORAL | 0 refills | Status: DC

## 2023-11-06 NOTE — Telephone Encounter (Signed)
 COVID Test was negative, so we will see her in the office

## 2023-11-06 NOTE — Progress Notes (Signed)
 Patient Care Team: Perri Ronal PARAS, MD as PCP - General (Internal Medicine)  Visit Date: 11/06/23  Subjective:   Chief Complaint  Patient presents with   Cough    Since last Friday.    Nasal Congestion   Chills   Patient PI:Rbwuypj C Crudup,Female DOB:07-Mar-1956,67 y.o.MRN:[id number]   67 y.o. Female presents today for acute sick visit with non-productive cough since Friday, nasal congestion, and chills . Notes her cough became productive yesterday with expectoration of brown sputum. Covid and Flu test NEG. Not UTD on influenza vaccination. Endorses lightheaded/dizziness when turning head, fever of 101 (has since resolved), chills, malaise/fatigue, low/no appetite. Denies sore throat and headache.   Past Medical History:  Diagnosis Date   Aortic atherosclerosis (HCC)    Cholelithiasis    Colon polyps    DDD (degenerative disc disease), lumbar    Diverticulosis    GERD (gastroesophageal reflux disease)    Hemorrhoids    Herniated disc, cervical    c6-7   Hiatal hernia    Hx of spinal stenosis    Hypoglycemic reaction    Internal hemorrhoids    Mitral valve prolapse    Osteopenia    -1.7 2/05; -1.4 3/07; -2.2 7/11   Uterine fibroid    Vitamin D  deficiency    26 4/10; 28 5/11    Family History  Problem Relation Age of Onset   Cancer Maternal Aunt        colon   Colon cancer Maternal Aunt    Hypertension Mother    Colon polyps Father    Heart disease Brother    Diabetes Paternal Aunt    Breast cancer Cousin        maternal cousin   Cancer Son        Appendix   Social history: 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  Is a smoker.  1 daughter.  Review of Systems  Constitutional:  Positive for chills, fever (101, since resolved) and malaise/fatigue.       (+) low/no appetite  HENT:  Negative for congestion.   Eyes:  Negative for blurred vision.  Respiratory:  Negative for cough and shortness of breath.    Cardiovascular:  Negative for chest pain, palpitations and leg swelling.  Gastrointestinal:  Negative for vomiting.  Musculoskeletal:  Negative for back pain.  Skin:  Negative for rash.  Neurological:  Positive for dizziness. Negative for loss of consciousness and headaches.     Objective:  Vitals: BP 100/60   Pulse 99   Temp 97.6 F (36.4 C)   Ht 5' 3 (1.6 m)   LMP 11/04/1996   SpO2 96%   BMI 17.54 kg/m   Physical Exam Vitals and nursing note reviewed.  Constitutional:      General: She is not in acute distress.    Appearance: Normal appearance. She is not toxic-appearing.  HENT:     Head: Normocephalic and atraumatic.     Right Ear: Tympanic membrane, ear canal and external ear normal.     Left Ear: Tympanic membrane, ear canal and external ear normal.     Mouth/Throat:     Pharynx: Oropharynx is clear.  Cardiovascular:     Rate and Rhythm: Normal rate and regular rhythm. No extrasystoles are present.    Pulses: Normal pulses.     Heart sounds: Normal heart sounds. No murmur heard.    No friction rub. No gallop.  Pulmonary:  Effort: Pulmonary effort is normal. No respiratory distress.     Breath sounds: Normal breath sounds. No wheezing or rales.  Skin:    General: Skin is warm and dry.  Neurological:     Mental Status: She is alert and oriented to person, place, and time. Mental status is at baseline.  Psychiatric:        Mood and Affect: Mood normal.        Behavior: Behavior normal.        Thought Content: Thought content normal.        Judgment: Judgment normal.     Results:  Studies obtained and personally reviewed by me:  Labs:     Component Value Date/Time   NA 143 03/14/2023 1010   K 4.9 03/14/2023 1010   CL 104 03/14/2023 1010   CO2 27 03/14/2023 1010   GLUCOSE 76 03/14/2023 1010   BUN 9 03/14/2023 1010   CREATININE 0.75 03/14/2023 1010   CALCIUM  10.7 (H) 03/14/2023 1010   PROT 8.0 03/14/2023 1010   ALBUMIN 4.4 08/20/2022 1311   AST 27  03/14/2023 1010   ALT 19 03/14/2023 1010   ALKPHOS 94 08/20/2022 1311   BILITOT 1.0 03/14/2023 1010   GFRNONAA 86 08/21/2020 0948   GFRAA 100 08/21/2020 0948    Lab Results  Component Value Date   WBC 7.8 03/14/2023   HGB 17.8 (H) 03/14/2023   HCT 51.4 (H) 03/14/2023   MCV 101.4 (H) 03/14/2023   PLT 253 03/14/2023   Lab Results  Component Value Date   CHOL 167 09/16/2023   HDL 80 09/16/2023   LDLCALC 71 09/16/2023   TRIG 86 09/16/2023   CHOLHDL 2.1 09/16/2023    Lab Results  Component Value Date   TSH 2.56 09/19/2023   Results for orders placed or performed in visit on 11/06/23  POCT Flu A & B Status   Collection Time: 11/06/23  3:59 PM  Result Value Ref Range   Influenza A, POC Negative Negative   Influenza B, POC Negative Negative   Assessment & Plan:   Acute Sinusitis: Covid-19 and Influenza tests NEGATIVE. Sending in 875 mg Augmentin  - take twice a day with food for 10 days, and Hycodan syrup - take 1 teaspoon every 8 hours as needed for cough. Rest and stay well hydrated; contact us  if symptoms persist/worsen despite treatment!   I,Emily Lagle,acting as a neurosurgeon for Ronal JINNY Hailstone, MD.,have documented all relevant documentation on the behalf of Ronal JINNY Hailstone, MD,as directed by  Ronal JINNY Hailstone, MD while in the presence of Ronal JINNY Hailstone, MD.   I, Ronal JINNY Hailstone, MD, have reviewed all documentation for this visit. The documentation on 11/15/23 for the exam, diagnosis, procedures, and orders are all accurate and complete.

## 2023-11-06 NOTE — Telephone Encounter (Signed)
 Copied from CRM [id number]. Topic: General - Other >> Nov 06, 2023  1:28 PM Fredrica W wrote: Reason for CRM: Patient returning call request to speak to [facility]. Would like Burna Mortimer to give her ca ll back. Called [facility] - Closed for lunch

## 2023-11-06 NOTE — Telephone Encounter (Signed)
 Called and ask Wenda to take COVID test and call back with results.

## 2023-11-06 NOTE — Patient Instructions (Signed)
 Augmentin 875 mg twice daily  by mouth x 10 days. Hycodan one tsp every 8 hours as needed for cough. Rest and stay well hydrated.

## 2023-11-15 ENCOUNTER — Encounter: Payer: Self-pay | Admitting: Internal Medicine

## 2024-03-26 ENCOUNTER — Other Ambulatory Visit: Payer: Self-pay | Admitting: Internal Medicine

## 2024-03-26 NOTE — Telephone Encounter (Signed)
 Last Fill: 05/05/23  Last OV: 11/06/23 Next OV: 10/08/24  Routing to provider for review/authorization.

## 2024-03-26 NOTE — Telephone Encounter (Unsigned)
 Copied from CRM [id number]. Topic: Clinical - Medication Refill >> Mar 26, 2024  3:00 PM Paige Lawrence wrote: Medication: ALPRAZolam  (XANAX ) 0.5 MG tablet - for vertigo episodes   Has the patient contacted their pharmacy? Yes (Agent: If no, request that the patient contact the pharmacy for the refill. If patient does not wish to contact the pharmacy document the reason why and proceed with request.) (Agent: If yes, when and what did the pharmacy advise?) expired 6 months need new Rx  This is the patient's preferred pharmacy:  [facility] [address] [place], [place] - [address] [place] [address] Phone: [phone] Fax: [phone]  Is this the correct pharmacy for this prescription? Yes If no, delete pharmacy and type the correct one.   Has the prescription been filled recently? No  Is the patient out of the medication? Yes  Has the patient been seen for an appointment in the last year OR does the patient have an upcoming appointment? Yes  Can we respond through MyChart? No  Agent: Please be advised that Rx refills may take up to 3 business days. We ask that you follow-up with your pharmacy.

## 2024-03-30 MED ORDER — ALPRAZOLAM 0.5 MG PO TABS: ORAL_TABLET | ORAL | 2 refills | Status: DC

## 2024-08-27 ENCOUNTER — Ambulatory Visit: Admitting: Gastroenterology

## 2024-08-27 ENCOUNTER — Encounter: Payer: Self-pay | Admitting: Gastroenterology

## 2024-08-27 VITALS — BP 126/68 | HR 76 | Ht 62.5 in | Wt 101.0 lb

## 2024-08-27 DIAGNOSIS — K648 Other hemorrhoids: Secondary | ICD-10-CM

## 2024-08-27 DIAGNOSIS — N319 Neuromuscular dysfunction of bladder, unspecified: Secondary | ICD-10-CM | POA: Diagnosis not present

## 2024-08-27 DIAGNOSIS — K5909 Other constipation: Secondary | ICD-10-CM

## 2024-08-27 DIAGNOSIS — Z860100 Personal history of colon polyps, unspecified: Secondary | ICD-10-CM

## 2024-08-27 NOTE — Patient Instructions (Signed)
 _______________________________________________________  If your blood pressure at your visit was 140/90 or greater, please contact your primary care physician to follow up on this.  _______________________________________________________  If you are age 68 or older, your body mass index should be between 23-30. Your Body mass index is 18.18 kg/m. If this is out of the aforementioned range listed, please consider follow up with your Primary Care Provider.  If you are age 59 or younger, your body mass index should be between 19-25. Your Body mass index is 18.18 kg/m. If this is out of the aformentioned range listed, please consider follow up with your Primary Care Provider.   ________________________________________________________  The [facility] providers would like to encourage you to use MYCHART to communicate with providers for non-urgent requests or questions.  Due to long hold times on the telephone, sending your provider a message by [facility] may be a faster and more efficient way to get a response.  Please allow 48 business hours for a response.  Please remember that this is for non-urgent requests.  _______________________________________________________  [facility] is using a team-based approach to care.  Your team is made up of your doctor and two to three APPS. Our APPS (Nurse Practitioners and Physician Assistants) work with your physician to ensure care continuity for you. They are fully qualified to address your health concerns and develop a treatment plan. They communicate directly with your gastroenterologist to care for you. Seeing the Advanced Practice Practitioners on your physician's team can help you by facilitating care more promptly, often allowing for earlier appointments, access to diagnostic testing, procedures, and other specialty referrals.   INCREASE Miralax to 1 capful daily  Thank you for entrusting me with your care and choosing [facility].  Paige Lawrence

## 2024-08-27 NOTE — Progress Notes (Signed)
 Chief Complaint: Chronic constipation Primary GI MD: Dr. Albertus  HPI: 68 year old female history of colon polyps, diverticulitis (06/23/2020 and 07/24/2021), neurogenic bladder who self caths, presents for evaluation of chronic constipation  Last seen by Elida Nyle Sharps, NP 08/2022 for recurrent LLQ pain and constipation recommended to take MiraLAX.  Patient states she has been doing well on MiraLAX half a cap per night since that time.  She did get a CT scan 2024 which was unrevealing.  At that time she was having some abdominal pain but CT scan did not show diverticulitis.  States 5 weeks ago she began having diarrhea for a few days then no bowel movement for 4 days and then she began having small-volume stools for a few days and then no bowel movement for another week.  She continued on half a cap MiraLAX and continues to struggle with constipation.  She also states that there is a feeling of a pulling sensation in her rectum when she lifts things such as a bag of change or grocery cart.  Denies melena/hematochezia.  Denies unintentional weight loss.  PREVIOUS GI WORKUP   Colonoscopy 11/16/2020: - One 3 mm polyp at the ileocecal valve, removed with a cold biopsy forceps. Resected and retrieved. - One 3 mm polyp in the ascending colon, removed with a cold biopsy forceps. Resected and retrieved. retrieved. - Severe diverticulosis confined to the mid and distal sigmoid colon. There was narrowing of the colon in association with the diverticular opening but no discrete strictures. - Internal hemorrhoids - Recall colonoscopy 7 years Surgical [P], small bowel, ileocecal valve; colon, ascending, polyp (2) - TUBULAR ADENOMA, NEGATIVE FOR HIGH GRADE DYSPLASIA (X1). - COLONIC MUCOSA WITH UNDERLYING LYMPHOID AGGREGATE, NEGATIVE FOR DYSPLASIA (X1).  Past Medical History:  Diagnosis Date   Aortic atherosclerosis    Cholelithiasis    Colon polyps    DDD (degenerative disc disease),  lumbar    Diverticulosis    GERD (gastroesophageal reflux disease)    Hemorrhoids    Herniated disc, cervical    c6-7   Hiatal hernia    Hx of spinal stenosis    Hypoglycemic reaction    Internal hemorrhoids    Mitral valve prolapse    Osteopenia    -1.7 2/05; -1.4 3/07; -2.2 7/11   Uterine fibroid    Vitamin D  deficiency    26 4/10; 28 5/11    Past Surgical History:  Procedure Laterality Date   CESAREAN SECTION     COLONOSCOPY  11/16/2020   WISDOM TOOTH EXTRACTION      Current Outpatient Medications  Medication Sig Dispense Refill   ALPRAZolam  (XANAX ) 0.5 MG tablet One tab by mouth up to 3 times daily as needed for acute vertigo 45 tablet 2   Cholecalciferol (VITAMIN D3 PO) Take 1 tablet by mouth daily.     nicotine  polacrilex (NICORETTE ) 4 MG gum Take 1 each (4 mg total) by mouth as needed for smoking cessation. 100 tablet 3   polyethylene glycol (MIRALAX / GLYCOLAX) 17 g packet Take 17 g by mouth daily.     pantoprazole  (PROTONIX ) 40 MG tablet Take 1 tablet (40 mg total) by mouth daily. (Patient not taking: Reported on 08/27/2024) 30 tablet 6   No current facility-administered medications for this visit.    Allergies as of 08/27/2024 - Review Complete 08/27/2024  Allergen Reaction Noted   Aspirin Other (See Comments)    Ibuprofen Other (See Comments)    Ivp dye [iodinated contrast media] Hives 12/01/2020  Sulfa antibiotics Other (See Comments)    Ciprofloxacin  hcl Rash 06/17/2020    Family History  Problem Relation Age of Onset   Cancer Maternal Aunt        colon   Colon cancer Maternal Aunt    Hypertension Mother    Colon polyps Father    Heart disease Brother    Diabetes Paternal Aunt    Breast cancer Cousin        maternal cousin   Cancer Son        Appendix    Social History   Socioeconomic History   Marital status: Divorced    Spouse name: Not on file   Number of children: Not on file   Years of education: Not on file   Highest education  level: Not on file  Occupational History   Not on file  Tobacco Use   Smoking status: Every Day    Current packs/day: 0.50    Types: Cigarettes   Smokeless tobacco: Never  Vaping Use   Vaping status: Never Used  Substance and Sexual Activity   Alcohol use: Not Currently   Drug use: No   Sexual activity: Not Currently    Birth control/protection: Post-menopausal    Comment: 1st intercourse 14 yo-5 partners  Other Topics Concern   Not on file  Social History Narrative   Not on file   Social Drivers of Health   Financial Resource Strain: Low Risk  (09/26/2023)   Overall Financial Resource Strain (CARDIA)    Difficulty of Paying Living Expenses: Not hard at all  Food Insecurity: No Food Insecurity (09/26/2023)   Hunger Vital Sign    Worried About Running Out of Food in the Last Year: Never true    Ran Out of Food in the Last Year: Never true  Transportation Needs: No Transportation Needs (09/26/2023)   PRAPARE - Administrator, Civil Service (Medical): No    Lack of Transportation (Non-Medical): No  Physical Activity: Insufficiently Active (09/26/2023)   Exercise Vital Sign    Days of Exercise per Week: 7 days    Minutes of Exercise per Session: 20 min  Stress: No Stress Concern Present (09/26/2023)   [employer] of Occupational Health - Occupational Stress Questionnaire    Feeling of Stress : Not at all  Social Connections: Moderately Integrated (09/26/2023)   Social Connection and Isolation Panel    Frequency of Communication with Friends and Family: More than three times a week    Frequency of Social Gatherings with Friends and Family: Twice a week    Attends Religious Services: More than 4 times per year    Active Member of [employer] or Organizations: No    Attends Engineer, structural: More than 4 times per year    Marital Status: Divorced  Catering manager Violence: Not At Risk (09/26/2023)   Humiliation, Afraid, Rape, and Kick questionnaire     Fear of Current or Ex-Partner: No    Emotionally Abused: No    Physically Abused: No    Sexually Abused: No    Review of Systems:    Constitutional: No weight loss, fever, chills, weakness or fatigue HEENT: Eyes: No change in vision               Ears, Nose, Throat:  No change in hearing or congestion Skin: No rash or itching Cardiovascular: No chest pain, chest pressure or palpitations   Respiratory: No SOB or cough Gastrointestinal: See HPI and otherwise negative  Genitourinary: No dysuria or change in urinary frequency Neurological: No headache, dizziness or syncope Musculoskeletal: No new muscle or joint pain Hematologic: No bleeding or bruising Psychiatric: No history of depression or anxiety    Physical Exam:  Vital signs: BP 126/68 (BP Location: Left Arm, Patient Position: Sitting, Cuff Size: Normal)   Pulse 76   Ht 5' 2.5 (1.588 m) Comment: height measured without shoes  Wt 101 lb (45.8 kg)   LMP 11/04/1996   BMI 18.18 kg/m   Constitutional: NAD, alert and cooperative Head:  Normocephalic and atraumatic. Eyes:   PEERL, EOMI. No icterus. Conjunctiva pink. Respiratory: Respirations even and unlabored. Lungs clear to auscultation bilaterally.   No wheezes, crackles, or rhonchi.  Cardiovascular:  Regular rate and rhythm. No peripheral edema, cyanosis or pallor.  Gastrointestinal:  Soft, nondistended, nontender. No rebound or guarding. Normal bowel sounds. No appreciable masses or hepatomegaly. Rectal: Multiple external hemorrhoids, normal sphincter tone, no obvious internal hemorrhoid or stool burden.  Stool brown heme-negative Msk:  Symmetrical without gross deformities. Without edema, no deformity or joint abnormality.  Neurologic:  Alert and  oriented x4;  grossly normal neurologically.  Skin:   Dry and intact without significant lesions or rashes. Psychiatric: Oriented to person, place and time. Demonstrates good judgement and reason without abnormal affect or  behaviors.  RELEVANT LABS AND IMAGING: CBC    Component Value Date/Time   WBC 7.8 03/14/2023 1010   RBC 5.07 03/14/2023 1010   HGB 17.8 (H) 03/14/2023 1010   HCT 51.4 (H) 03/14/2023 1010   PLT 253 03/14/2023 1010   MCV 101.4 (H) 03/14/2023 1010   MCH 35.1 (H) 03/14/2023 1010   MCHC 34.6 03/14/2023 1010   RDW 12.1 03/14/2023 1010   LYMPHSABS 1,381 03/14/2023 1010   MONOABS 0.5 11/01/2008 1636   EOSABS 47 03/14/2023 1010   BASOSABS 47 03/14/2023 1010    CMP     Component Value Date/Time   NA 143 03/14/2023 1010   K 4.9 03/14/2023 1010   CL 104 03/14/2023 1010   CO2 27 03/14/2023 1010   GLUCOSE 76 03/14/2023 1010   BUN 9 03/14/2023 1010   CREATININE 0.75 03/14/2023 1010   CALCIUM  10.7 (H) 03/14/2023 1010   PROT 8.0 03/14/2023 1010   ALBUMIN 4.4 08/20/2022 1311   AST 27 03/14/2023 1010   ALT 19 03/14/2023 1010   ALKPHOS 94 08/20/2022 1311   BILITOT 1.0 03/14/2023 1010   GFRNONAA 86 08/21/2020 0948   GFRAA 100 08/21/2020 0948     Assessment/Plan:   68 year old female with past history of diverticulitis (2021 and 2022) with severe diverticulosis and narrowing of colon on last colonoscopy, neurogenic bladder who self caths, and chronic constipation presenting for worsening constipation  Acute on chronic constipation Suspect multifactorial with history of neurogenic bladder and narrowing of colon in association with diverticulosis without obvious stricture.  Reassuring CT abdomen pelvis 2024 (Care Everywhere) previously well-managed on half a cap MiraLAX now having recurrent constipation.  Reassuring recent colonoscopy 2022. - Increase MiraLAX to 1 capful daily at bedtime - Squatty potty with bowel movements - Increase water, increase fiber, increase exercise - Follow-up 6 to 8 weeks  Pulling sensation in rectum Internal hemorrhoids noted on colonoscopy 2022, worse with lifting things.  Suspect her worsening constipation is leading to worsening hemorrhoids but no  obvious abnormality on rectal exam reassuringly - Control constipation with above regimen - Can use over-the-counter suppositories  History of tubular adenoma Colonoscopy 11/2020 with 2 small tubular adenomas (3  mm) and severe diverticulosis with narrowing associated with diverticula but no obvious stricture.  Maternal aunt with colon cancer.  Father with colon polyps. - Next due 11/2027  Neurogenic bladder Follows with urology, self caths  Anxiety  Nhat Hearne Mollie RIGGERS [facility] 08/27/2024, 11:01 AM  Cc: Perri Ronal PARAS, MD

## 2024-08-30 NOTE — Progress Notes (Signed)
 Addendum: Reviewed and agree with assessment and management plan. Agree with increased MiraLax to aid with regular BMs If persistent lower GI symptoms I recommend colorectal surgery consult for symptomatic sigmoid diverticular disease -- this would likely dramatically improve such symptoms Garlon Tuggle, Gordy HERO, MD

## 2024-09-10 ENCOUNTER — Other Ambulatory Visit: Payer: Self-pay | Admitting: Internal Medicine

## 2024-09-10 DIAGNOSIS — Z1231 Encounter for screening mammogram for malignant neoplasm of breast: Secondary | ICD-10-CM

## 2024-09-27 NOTE — Progress Notes (Incomplete)
 Annual Wellness Visit   Patient Care Team: Perri Ronal PARAS, MD as PCP - General (Internal Medicine)  Visit Date: 09/27/24   No chief complaint on file.  Subjective:  Patient: Paige Lawrence, Female DOB: [birth year], 68 y.o. MRN: [id number] There were no vitals filed for this visit. Paige Lawrence is a 68 y.o. Female who presents today for her Annual Wellness Visit. Patient has History of smoking; COPD (chronic obstructive pulmonary disease) (HCC); Palpitations; Diverticulitis of colon; LLQ pain; Osteoporosis; and History of anxiety on their problem list.  History of vertigo. Treated with alprazolam  0.5 mg TID as needed. At our visit 05/05/2023 she was referred to vestibular rehab for Epley maneuver. She confirms completing her PT which was helpful.    History of CAD. She was seen by her cardiologist Dr. Lonni Nanas 09/16/2023. At that visit she was doing well from a cardiovascular perspective. She was prescribed nicotine  gum to aid in tobacco cessation. Initially seen by him 05/09/23 for evaluation of chest pain. She had a calcium  score of 29 (66th percentile) 03/2023. Echo 06/2023 with LVEF 60-65%, normal RV function, myxomatous mitral valve with prolapse but no significant MR.    She has a history of voiding dysfunction that started with hospitalization for diverticulutis. Is followed by Dr. Selma at [facility] and does self cath about 4 times a day.    Past medical history: History of anxiety disorder. History of fractured clavicle in the remote past. C-section 1980.   Labs ***/***/*** {Labs (Optional):31667}   10/20/2023 Mammogram no mammographic evidence of malignancy.    11/16/2020 Colonoscopy One 3 mm polyp at the ileocecal valve. Resected and retrieved. One 3 mm polyp in the ascending colon. Resected and retrieved. One polyp was was benign but precancerous and one was benign and not precancerous. Severe diverticulosis confined to the mid and distal sigmoid colon. There  was narrowing of the colon in association with the diverticular opening but no discrete strictures.Internal hemorrhoids. Repeat in 7 years.   Health Maintenance  Topic Date Due   DTaP/Tdap/Td (1 - Tdap) Never done   Pneumococcal Vaccine: 50+ Years (1 of 2 - PCV) Never done   Zoster Vaccines- Shingrix (1 of 2) Never done   Influenza Vaccine  Never done   COVID-19 Vaccine (1 - 2025-26 season) Never done   Medicare Annual Wellness (AWV)  09/25/2024   Mammogram  10/19/2025   Colonoscopy  11/17/2027   Bone Density Scan  Completed   Meningococcal B Vaccine  Aged Out   Hepatitis C Screening  Discontinued    {Man or Woman:32389}  Vaccine Counseling: Due for {Vaccines:32291::Influenza}; UTD on {Vaccines:32291::Influenza}  ROS Objective:  Vitals: body mass index is unknown because there is no height or weight on file.There were no vitals filed for this visit. Physical Exam  Current Outpatient Medications  Medication Instructions   ALPRAZolam  (XANAX ) 0.5 MG tablet One tab by mouth up to 3 times daily as needed for acute vertigo   Cholecalciferol (VITAMIN D3 PO) 1 tablet, Daily   nicotine  polacrilex (NICORETTE ) 4 mg, Oral, As needed   pantoprazole  (PROTONIX ) 40 mg, Oral, Daily   polyethylene glycol (MIRALAX / GLYCOLAX) 17 g, Daily   Past Medical History:  Diagnosis Date   Aortic atherosclerosis    Cholelithiasis    Colon polyps    DDD (degenerative disc disease), lumbar    Diverticulosis    GERD (gastroesophageal reflux disease)    Hemorrhoids    Herniated disc, cervical    c6-7  Hiatal hernia    Hx of spinal stenosis    Hypoglycemic reaction    Internal hemorrhoids    Mitral valve prolapse    Osteopenia    -1.7 2/05; -1.4 3/07; -2.2 7/11   Uterine fibroid    Vitamin D  deficiency    26 4/10; 28 5/11   Medical/Surgical History Narrative:  Allergic/Intolerant to:  Allergies  Allergen Reactions   Aspirin Other (See Comments)    Burning in stomach   Ibuprofen Other  (See Comments)    Burning pressure on head   Ivp Dye [Iodinated Contrast Media] Hives   Sulfa Antibiotics Other (See Comments)    Leg weakness, burning pressure on head   Ciprofloxacin  Hcl Rash   *** - ***  *** - ***  *** - ***  *** - ***  *** - ***  *** - ***  *** - ***  *** - *** Other - Hx of: *** ; Surghx of: *** Past Surgical History:  Procedure Laterality Date   CESAREAN SECTION     COLONOSCOPY  11/16/2020   WISDOM TOOTH EXTRACTION     Family History  Problem Relation Age of Onset   Cancer Maternal Aunt        colon   Colon cancer Maternal Aunt    Hypertension Mother    Colon polyps Father    Heart disease Brother    Diabetes Paternal Aunt    Breast cancer Cousin        maternal cousin   Cancer Son        Appendix   Family History Narrative: {ELFamHX:31110} Social History   Social History Narrative   Not on file   Most Recent Health Risks Assessment:   Most Recent Social Determinants of Health (Including Hx of Tobacco, Alcohol, and Drug Use) SDOH Screenings   Food Insecurity: No Food Insecurity (09/26/2023)  Housing: Low Risk  (09/26/2023)  Transportation Needs: No Transportation Needs (09/26/2023)  Utilities: Not At Risk (09/26/2023)  Alcohol Screen: Low Risk  (09/26/2023)  Depression (PHQ2-9): Low Risk  (09/26/2023)  Financial Resource Strain: Low Risk  (09/26/2023)  Physical Activity: Insufficiently Active (09/26/2023)  Social Connections: Moderately Integrated (09/26/2023)  Stress: No Stress Concern Present (09/26/2023)  Tobacco Use: High Risk (08/27/2024)  Health Literacy: Adequate Health Literacy (09/26/2023)   Social History   Tobacco Use   Smoking status: Every Day    Current packs/day: 0.50    Types: Cigarettes   Smokeless tobacco: Never  Vaping Use   Vaping status: Never Used  Substance Use Topics   Alcohol use: Not Currently   Drug use: No   Most Recent Functional Status Assessment:     No data to display          Most Recent Fall Risk Assessment:    09/26/2023    3:09 PM  Fall Risk   Falls in the past year? 0  Number falls in past yr: 0  Injury with Fall? 0  Risk for fall due to : No Fall Risks  Follow up Falls prevention discussed;Education provided;Falls evaluation completed   Most Recent Anxiety/Depression Screenings:    09/26/2023    3:09 PM 03/14/2023    9:38 AM  PHQ 2/9 Scores  PHQ - 2 Score 0 0       No data to display         Most Recent Cognitive Screening:    09/26/2023    3:11 PM  6CIT Screen  What Year? 0 points  What  month? 0 points  What time? 0 points  Count back from 20 0 points  Months in reverse 0 points  Repeat phrase 0 points  Total Score 0 points   Most Recent Vision/Hearing Screenings:No results found. Results:  Studies Obtained And Personally Reviewed By Me: Diabetic Foot Exam - Simple   No data filed     {Imaging, colonoscopy, mammogram, bone density scan, echocardiogram, heart cath, stress test, CT calcium  score, etc.:32292}  Labs:  CBC w/ Differential Lab Results  Component Value Date   WBC 7.8 03/14/2023   RBC 5.07 03/14/2023   HGB 17.8 (H) 03/14/2023   HCT 51.4 (H) 03/14/2023   PLT 253 03/14/2023   MCV 101.4 (H) 03/14/2023   MCH 35.1 (H) 03/14/2023   MCHC 34.6 03/14/2023   RDW 12.1 03/14/2023   MPV 10.7 03/14/2023   LYMPHSABS 1,381 03/14/2023   MONOABS 0.5 11/01/2008   BASOSABS 47 03/14/2023    Comprehensive Metabolic Panel Lab Results  Component Value Date   NA 143 03/14/2023   K 4.9 03/14/2023   CL 104 03/14/2023   CO2 27 03/14/2023   GLUCOSE 76 03/14/2023   BUN 9 03/14/2023   CREATININE 0.75 03/14/2023   CALCIUM  10.7 (H) 03/14/2023   PROT 8.0 03/14/2023   ALBUMIN 4.4 08/20/2022   AST 27 03/14/2023   ALT 19 03/14/2023   ALKPHOS 94 08/20/2022   BILITOT 1.0 03/14/2023   GFR 83.30 08/20/2022   EGFR 88 03/14/2023   GFRNONAA 86 08/21/2020   Lipid Panel  Lab Results  Component Value Date   CHOL 167 09/16/2023    HDL 80 09/16/2023   LDLCALC 71 09/16/2023   TRIG 86 09/16/2023   A1c No results found for: HGBA1C  TSH Lab Results  Component Value Date   TSH 2.56 09/19/2023   PSA{PSA (Optional):32132} No results found for any visits on 10/08/24. Assessment & Plan:  No orders of the defined types were placed in this encounter.  No orders of the defined types were placed in this encounter.  Other Labs Reviewed today:    No follow-ups on file.   {ELAWVOptions:31280} done today including the all of the following: Reviewed patient's Family Medical History Reviewed patient's SDOH and reviewed tobacco, alcohol, and drug use.  Reviewed and updated list of patient's medical providers Assessment of cognitive impairment was done Assessed patient's functional ability Established a written schedule for health screening services Health Risk Assessent Completed and Reviewed  Discussed health benefits of physical activity, and encouraged her to engage in regular exercise appropriate for her age and condition.   I,Emily Lagle,acting as a neurosurgeon for Ronal JINNY Hailstone, MD.,have documented all relevant documentation on the behalf of Ronal JINNY Hailstone, MD,as directed by  Ronal JINNY Hailstone, MD while in the presence of Ronal JINNY Hailstone, MD.  I, Ronal JINNY Hailstone, MD, have reviewed all documentation for and agree with the above Annual Wellness Visit documentation.  Ronal JINNY Hailstone, MD Internal Medicine 10/08/2024

## 2024-10-04 ENCOUNTER — Other Ambulatory Visit

## 2024-10-04 DIAGNOSIS — Z0000 Encounter for general adult medical examination without abnormal findings: Secondary | ICD-10-CM

## 2024-10-04 DIAGNOSIS — Z1329 Encounter for screening for other suspected endocrine disorder: Secondary | ICD-10-CM

## 2024-10-04 DIAGNOSIS — Z13220 Encounter for screening for lipoid disorders: Secondary | ICD-10-CM

## 2024-10-04 DIAGNOSIS — M810 Age-related osteoporosis without current pathological fracture: Secondary | ICD-10-CM

## 2024-10-04 DIAGNOSIS — M8580 Other specified disorders of bone density and structure, unspecified site: Secondary | ICD-10-CM

## 2024-10-04 LAB — CBC WITH DIFFERENTIAL/PLATELET
Absolute Lymphocytes: 2136 {cells}/uL (ref 850–3900)
Absolute Monocytes: 564 {cells}/uL (ref 200–950)
Basophils Absolute: 42 {cells}/uL (ref 0–200)
Basophils Relative: 0.7 %
Eosinophils Absolute: 90 {cells}/uL (ref 15–500)
Eosinophils Relative: 1.5 %
HCT: 48.6 % — ABNORMAL HIGH (ref 35.9–46.0)
Hemoglobin: 16.5 g/dL — ABNORMAL HIGH (ref 11.7–15.5)
MCH: 34.3 pg — ABNORMAL HIGH (ref 27.0–33.0)
MCHC: 34 g/dL (ref 31.6–35.4)
MCV: 101 fL (ref 81.4–101.7)
MPV: 10.3 fL (ref 7.5–12.5)
Monocytes Relative: 9.4 %
Neutro Abs: 3168 {cells}/uL (ref 1500–7800)
Neutrophils Relative %: 52.8 %
Platelets: 281 Thousand/uL (ref 140–400)
RBC: 4.81 Million/uL (ref 3.80–5.10)
RDW: 11.9 % (ref 11.0–15.0)
Total Lymphocyte: 35.6 %
WBC: 6 Thousand/uL (ref 3.8–10.8)

## 2024-10-04 LAB — COMPREHENSIVE METABOLIC PANEL WITH GFR
AG Ratio: 1.8 (calc) (ref 1.0–2.5)
ALT: 17 U/L (ref 6–29)
AST: 18 U/L (ref 10–35)
Albumin: 4.4 g/dL (ref 3.6–5.1)
Alkaline phosphatase (APISO): 96 U/L (ref 37–153)
BUN: 10 mg/dL (ref 7–25)
CO2: 30 mmol/L (ref 20–32)
Calcium: 10.1 mg/dL (ref 8.6–10.4)
Chloride: 108 mmol/L (ref 98–110)
Creat: 0.7 mg/dL (ref 0.50–1.05)
Globulin: 2.5 g/dL (ref 1.9–3.7)
Glucose, Bld: 90 mg/dL (ref 65–99)
Potassium: 5.5 mmol/L — ABNORMAL HIGH (ref 3.5–5.3)
Sodium: 144 mmol/L (ref 135–146)
Total Bilirubin: 0.8 mg/dL (ref 0.2–1.2)
Total Protein: 6.9 g/dL (ref 6.1–8.1)
eGFR: 95 mL/min/1.73m2 (ref >=60)

## 2024-10-04 LAB — LIPID PANEL
Cholesterol: 163 mg/dL (ref <200)
HDL: 69 mg/dL (ref >=50)
LDL Cholesterol (Calc): 80 mg/dL
Non-HDL Cholesterol (Calc): 94 mg/dL (ref <130)
Total CHOL/HDL Ratio: 2.4 (calc) (ref <5.0)
Triglycerides: 60 mg/dL (ref <150)

## 2024-10-04 LAB — TSH: TSH: 1.86 m[IU]/L (ref 0.40–4.50)

## 2024-10-05 ENCOUNTER — Ambulatory Visit: Admitting: Internal Medicine

## 2024-10-07 ENCOUNTER — Other Ambulatory Visit: Payer: Medicare Other

## 2024-10-08 ENCOUNTER — Ambulatory Visit: Payer: Medicare Other | Admitting: Internal Medicine

## 2024-10-08 NOTE — Progress Notes (Signed)
 "  Annual Wellness Visit   Patient Care Team: Perri Ronal PARAS, MD as PCP - General (Internal Medicine)  Visit Date: 10/22/2024   Chief Complaint  Patient presents with   Annual Exam   Medicare Wellness   Subjective:  Patient: Paige Lawrence, Female DOB: 1956-05-23, 68 y.o. MRN: [id number] Vitals:   10/22/24 1454  BP: 110/60   Paige Lawrence is a 68 y.o. Female who presents today for her Annual Wellness Visit. Patient has History of smoking; COPD (chronic obstructive pulmonary disease) (HCC); Palpitations; Diverticulitis of colon; LLQ pain; Osteoporosis; and History of anxiety on their problem list.  History of vertigo, Anxiety. Treated with alprazolam  0.5 mg TID as needed. At our visit 05/05/2023 she was referred to vestibular rehab for Epley maneuver.  She said that her daughter has noticed that she is more anxious.    History of CAD. She was seen by her cardiologist Dr. Lonni Nanas 09/16/2023. At that visit she was doing well from a cardiovascular perspective. She was prescribed nicotine  gum to aid in tobacco cessation. Initially seen by him 05/09/23 for evaluation of chest pain. She had a calcium  score of 29 (66th percentile) 03/2023. Echo 06/2023 with LVEF 60-65%, normal RV function, myxomatous mitral valve with prolapse but no significant MR. She said that she is down to 8 cigarettes a day.    She has a history of voiding dysfunction that started with hospitalization for diverticulutis. Is followed by Dr. Selma at [facility] and does self cath about 4 times a day.    History of GE Reflux treated with Protonix  40 mg.   Past medical history: History of anxiety disorder. History of fractured clavicle in the remote past. C-section 1980.   Labs 10/04/2024 Hemoglobin 16.5, HCT 48.6, MCH 34.3, Potassium 5.5, Otherwise WNL.   10/20/2023 Mammogram no mammographic evidence of malignancy.    11/16/2020 Colonoscopy One 3 mm polyp at the ileocecal valve. Resected and retrieved. One  3 mm polyp in the ascending colon. Resected and retrieved. One polyp was was benign but precancerous and one was benign and not precancerous. Severe diverticulosis confined to the mid and distal sigmoid colon. There was narrowing of the colon in association with the diverticular opening but no discrete strictures.Internal hemorrhoids. Repeat in 7 years.    Vaccine counseling: tetanus vaccine due. Declined pneumonia, Covid-19, and Shingles vaccines.   Health maintenance: she has a Pap smear scheduled next month. She had mammogram this morning.   Health Maintenance  Topic Date Due   DTaP/Tdap/Td (1 - Tdap) Never done   COVID-19 Vaccine (1 - 2025-26 season) 11/07/2024 (Originally 07/05/2024)   Zoster Vaccines- Shingrix (1 of 2) 01/20/2025 (Originally 10/15/2006)   Influenza Vaccine  02/01/2025 (Originally 06/04/2024)   Pneumococcal Vaccine: 50+ Years (1 of 2 - PCV) 10/22/2025 (Originally 10/16/1975)   Mammogram  10/19/2025   Medicare Annual Wellness (AWV)  10/22/2025   Colonoscopy  11/17/2027   Bone Density Scan  Completed   Meningococcal B Vaccine  Aged Out   Hepatitis C Screening  Discontinued    Review of Systems  Constitutional:  Negative for fever and malaise/fatigue.  HENT:  Negative for congestion.   Eyes:  Negative for blurred vision.  Respiratory:  Negative for cough and shortness of breath.   Cardiovascular:  Negative for chest pain, palpitations and leg swelling.  Gastrointestinal:  Negative for vomiting.  Musculoskeletal:  Negative for back pain.  Skin:  Negative for rash.  Neurological:  Negative for loss of consciousness and  headaches.   Objective:  Vitals: body mass index is 16.54 kg/m. Today's Vitals   10/22/24 1454  BP: 110/60  Pulse: 88  SpO2: 95%  Weight: 100 lb (45.4 kg)  Height: 5' 5.2 (1.656 m)  PainSc: 0-No pain   Physical Exam Vitals and nursing note reviewed.  Constitutional:      General: She is not in acute distress.    Appearance: Normal  appearance. She is not ill-appearing or toxic-appearing.  HENT:     Head: Normocephalic and atraumatic.     Right Ear: Hearing, tympanic membrane, ear canal and external ear normal.     Left Ear: Hearing, tympanic membrane, ear canal and external ear normal.     Mouth/Throat:     Pharynx: Oropharynx is clear.  Eyes:     Extraocular Movements: Extraocular movements intact.     Pupils: Pupils are equal, round, and reactive to light.  Neck:     Thyroid: No thyroid mass, thyromegaly or thyroid tenderness.     Vascular: No carotid bruit.  Cardiovascular:     Rate and Rhythm: Normal rate and regular rhythm. No extrasystoles are present.    Pulses:          Dorsalis pedis pulses are 2+ on the right side and 2+ on the left side.     Heart sounds: Normal heart sounds. No murmur heard.    No friction rub. No gallop.  Pulmonary:     Effort: Pulmonary effort is normal.     Breath sounds: Normal breath sounds. No decreased breath sounds, wheezing, rhonchi or rales.  Chest:     Chest wall: No mass.  Abdominal:     Palpations: Abdomen is soft. There is no hepatomegaly, splenomegaly or mass.     Tenderness: There is no abdominal tenderness.     Hernia: No hernia is present.  Musculoskeletal:     Cervical back: Normal range of motion.     Right lower leg: No edema.     Left lower leg: No edema.  Lymphadenopathy:     Cervical: No cervical adenopathy.     Upper Body:     Right upper body: No supraclavicular adenopathy.     Left upper body: No supraclavicular adenopathy.  Skin:    General: Skin is warm and dry.     Comments: Benign nevis left anterior thigh   Neurological:     General: No focal deficit present.     Mental Status: She is alert and oriented to person, place, and time. Mental status is at baseline.     Sensory: Sensation is intact.     Motor: Motor function is intact. No weakness.     Deep Tendon Reflexes: Reflexes are normal and symmetric.  Psychiatric:        Attention and  Perception: Attention normal.        Mood and Affect: Mood normal.        Speech: Speech normal.        Behavior: Behavior normal.        Thought Content: Thought content normal.        Cognition and Memory: Cognition normal.        Judgment: Judgment normal.     Current Outpatient Medications  Medication Instructions   ALPRAZolam  (XANAX ) 0.5 mg, Oral, 2 times daily PRN   AMBULATORY NON FORMULARY MEDICATION Nitroglycerine ointment 0.125 %  Apply a pea sized amount internally every 6 to 8 hour for rectal pain.   Cholecalciferol (VITAMIN  D3 PO) 1 tablet, Daily   nicotine  polacrilex (NICORETTE ) 4 mg, Oral, As needed   pantoprazole  (PROTONIX ) 40 mg, Oral, Daily   polyethylene glycol (MIRALAX / GLYCOLAX) 17 g, Daily   Past Medical History:  Diagnosis Date   Aortic atherosclerosis    Arrhythmia 1998   Cholelithiasis    Colon polyps    DDD (degenerative disc disease), lumbar    Diverticulosis    GERD (gastroesophageal reflux disease)    Heart murmur 1998   Hemorrhoids    Herniated disc, cervical    c6-7   Hiatal hernia    Hx of spinal stenosis    Hypoglycemic reaction    Internal hemorrhoids    Mitral valve prolapse    Osteopenia    -1.7 2/05; -1.4 3/07; -2.2 7/11   Uterine fibroid    Vitamin D  deficiency    26 4/10; 28 5/11   Medical/Surgical History Narrative:  Allergic/Intolerant to:  Allergies  Allergen Reactions   Aspirin Other (See Comments)    Burning in stomach   Ibuprofen Other (See Comments)    Burning pressure on head   Ivp Dye [Iodinated Contrast Media] Hives   Sulfa Antibiotics Other (See Comments)    Leg weakness, burning pressure on head   Ciprofloxacin  Hcl Rash   Past Surgical History:  Procedure Laterality Date   CESAREAN SECTION     COLONOSCOPY  11/16/2020   WISDOM TOOTH EXTRACTION     Family History  Problem Relation Age of Onset   Cancer Maternal Aunt        colon   Colon cancer Maternal Aunt    Hypertension Mother    Colon polyps  Father    Heart disease Brother    Diabetes Paternal Aunt    Breast cancer Cousin        maternal cousin   Cancer Son        Appendix   Family History Narrative: Father with history of dementia.   Social history: Patient continues to smoke.  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  Most Recent Health Risks Assessment:   Most Recent Social Determinants of Health (Including Hx of Tobacco, Alcohol, and Drug Use) SDOH Screenings   Food Insecurity: No Food Insecurity (10/22/2024)  Housing: Low Risk (10/22/2024)  Transportation Needs: No Transportation Needs (10/22/2024)  Utilities: Not At Risk (10/22/2024)  Alcohol Screen: Low Risk (10/22/2024)  Depression (PHQ2-9): Low Risk (10/22/2024)  Financial Resource Strain: Low Risk (10/22/2024)  Physical Activity: Sufficiently Active (10/22/2024)  Social Connections: Moderately Integrated (10/22/2024)  Stress: No Stress Concern Present (10/22/2024)  Tobacco Use: High Risk (10/22/2024)  Health Literacy: Adequate Health Literacy (10/22/2024)   Social History   Tobacco Use   Smoking status: Every Day    Current packs/day: 0.50    Types: Cigarettes   Smokeless tobacco: Never  Vaping Use   Vaping status: Never Used  Substance Use Topics   Alcohol use: Not Currently   Drug use: No    Most Recent Fall Risk Assessment:    10/22/2024    2:52 PM  Fall Risk   Falls in the past year? 0  Number falls in past yr: 0  Injury with Fall? 0  Risk for fall due to : No Fall Risks  Follow up Falls prevention discussed;Education provided;Falls evaluation completed   Most Recent Anxiety/Depression Screenings:    10/22/2024    3:00 PM 09/26/2023    3:09 PM  PHQ 2/9 Scores  PHQ -  2 Score 0 0  PHQ- 9 Score 0     Most Recent Cognitive Screening:    09/26/2023    3:11 PM  6CIT Screen  What Year? 0 points  What month? 0 points  What time? 0 points  Count back from 20 0 points  Months in reverse  0 points  Repeat phrase 0 points  Total Score 0 points    Results:  Studies Obtained And Personally Reviewed By Me: 10/20/2023 Mammogram no mammographic evidence of malignancy.    11/16/2020 Colonoscopy One 3 mm polyp at the ileocecal valve. Resected and retrieved. One 3 mm polyp in the ascending colon. Resected and retrieved. One polyp was was benign but precancerous and one was benign and not precancerous. Severe diverticulosis confined to the mid and distal sigmoid colon. There was narrowing of the colon in association with the diverticular opening but no discrete strictures.Internal hemorrhoids. Repeat in 7 years.   Labs:  CBC w/ Differential Lab Results  Component Value Date   WBC 6.0 10/04/2024   RBC 4.81 10/04/2024   HGB 16.5 (H) 10/04/2024   HCT 48.6 (H) 10/04/2024   PLT 281 10/04/2024   MCV 101.0 10/04/2024   MCH 34.3 (H) 10/04/2024   MCHC 34.0 10/04/2024   RDW 11.9 10/04/2024   MPV 10.3 10/04/2024   LYMPHSABS 1,381 03/14/2023   MONOABS 0.5 11/01/2008   BASOSABS 42 10/04/2024    Comprehensive Metabolic Panel Lab Results  Component Value Date   NA 144 10/04/2024   K 5.5 (H) 10/04/2024   CL 108 10/04/2024   CO2 30 10/04/2024   GLUCOSE 90 10/04/2024   BUN 10 10/04/2024   CREATININE 0.70 10/04/2024   CALCIUM  10.1 10/04/2024   PROT 6.9 10/04/2024   ALBUMIN 4.4 08/20/2022   AST 18 10/04/2024   ALT 17 10/04/2024   ALKPHOS 94 08/20/2022   BILITOT 0.8 10/04/2024   GFR 83.30 08/20/2022   EGFR 95 10/04/2024   GFRNONAA 86 08/21/2020   Lipid Panel  Lab Results  Component Value Date   CHOL 163 10/04/2024   HDL 69 10/04/2024   LDLCALC 80 10/04/2024   TRIG 60 10/04/2024   TSH Lab Results  Component Value Date   TSH 1.86 10/04/2024   Assessment & Plan:   Meds ordered this encounter  Medications   ALPRAZolam  (XANAX ) 0.5 MG tablet    Sig: Take 1 tablet (0.5 mg total) by mouth 2 (two) times daily as needed for anxiety.    Dispense:  90 tablet    Refill:  1     Vertigo, Anxiety: Treated with alprazolam  0.5 mg TID as needed. At our visit 05/05/2023 she was referred to vestibular rehab for Epley maneuver.  She said that her daughter has noticed that she is more anxious.    Alprazolam  0.5 twice daily as needed for anxiety prescribed.    CAD: She was seen by her cardiologist Dr. Lonni Nanas 09/16/2023. At that visit she was doing well from a cardiovascular perspective. She was prescribed nicotine  gum to aid in tobacco cessation. Initially seen by him 05/09/23 for evaluation of chest pain. She had a calcium  score of 29 (66th percentile) 03/2023. Echo 06/2023 with LVEF 60-65%, normal RV function, myxomatous mitral valve with prolapse but no significant MR. She said that she is down to 8 cigarettes a day.    She has a history of voiding dysfunction that started with hospitalization for diverticulutis. Is followed by Dr. Selma at [facility] and does self cath about  4 times a day.    GE Reflux: treated with Protonix  40 mg.   10/20/2023 Mammogram no mammographic evidence of malignancy.    11/16/2020 Colonoscopy One 3 mm polyp at the ileocecal valve. Resected and retrieved. One 3 mm polyp in the ascending colon. Resected and retrieved. One polyp was was benign but precancerous and one was benign and not precancerous. Severe diverticulosis confined to the mid and distal sigmoid colon. There was narrowing of the colon in association with the diverticular opening but no discrete strictures.Internal hemorrhoids. Repeat in 7 years.    Vaccine counseling: tetanus vaccine due. Declined pneumonia, Covid-19, and Shingles vaccines.   Health maintenance: she has a Pap smear scheduled next month. She had mammogram this morning.      Annual Wellness Visit done today including the all of the following: Reviewed patient's Family Medical History Reviewed patient's SDOH and reviewed tobacco, alcohol, and drug use.  Reviewed and updated list of patient's medical  providers Assessment of cognitive impairment was done Assessed patient's functional ability Established a written schedule for health screening services Health Risk Assessent Completed and Reviewed  Discussed health benefits of physical activity, and encouraged her to engage in regular exercise appropriate for her age and condition.   I,Makayla C Reid,acting as a scribe for Ronal JINNY Hailstone, MD.,have documented all relevant documentation on the behalf of Ronal JINNY Hailstone, MD,as directed by  Ronal JINNY Hailstone, MD while in the presence of Ronal JINNY Hailstone, MD.   I, Ronal JINNY Hailstone, MD, have reviewed all documentation for and agree with the above Annual Wellness Visit documentation.  Ronal JINNY Hailstone, MD Internal Medicine 10/22/2024 "

## 2024-10-12 NOTE — Progress Notes (Unsigned)
 Chief Complaint: Follow-up of chronic constipation Primary GI MD: Dr. Albertus  HPI: 68 year old female history of colon polyps, diverticulitis (06/23/2020 and 07/24/2021), neurogenic bladder who self caths, presents for follow-up of chronic constipation   Last seen October 2025.  See this note for details  Discussed the use of AI scribe software for clinical note transcription with the patient, who gave verbal consent to proceed.  History of Present Illness      PREVIOUS GI WORKUP   Colonoscopy 11/16/2020: - One 3 mm polyp at the ileocecal valve, removed with a cold biopsy forceps. Resected and retrieved. - One 3 mm polyp in the ascending colon, removed with a cold biopsy forceps. Resected and retrieved. retrieved. - Severe diverticulosis confined to the mid and distal sigmoid colon. There was narrowing of the colon in association with the diverticular opening but no discrete strictures. - Internal hemorrhoids - Recall colonoscopy 7 years Surgical [P], small bowel, ileocecal valve; colon, ascending, polyp (2) - TUBULAR ADENOMA, NEGATIVE FOR HIGH GRADE DYSPLASIA (X1). - COLONIC MUCOSA WITH UNDERLYING LYMPHOID AGGREGATE, NEGATIVE FOR DYSPLASIA (X1).  Past Medical History:  Diagnosis Date   Aortic atherosclerosis    Cholelithiasis    Colon polyps    DDD (degenerative disc disease), lumbar    Diverticulosis    GERD (gastroesophageal reflux disease)    Hemorrhoids    Herniated disc, cervical    c6-7   Hiatal hernia    Hx of spinal stenosis    Hypoglycemic reaction    Internal hemorrhoids    Mitral valve prolapse    Osteopenia    -1.7 2/05; -1.4 3/07; -2.2 7/11   Uterine fibroid    Vitamin D  deficiency    26 4/10; 28 5/11    Past Surgical History:  Procedure Laterality Date   CESAREAN SECTION     COLONOSCOPY  11/16/2020   WISDOM TOOTH EXTRACTION      Current Outpatient Medications  Medication Sig Dispense Refill   ALPRAZolam  (XANAX ) 0.5 MG tablet One tab by  mouth up to 3 times daily as needed for acute vertigo 45 tablet 2   Cholecalciferol (VITAMIN D3 PO) Take 1 tablet by mouth daily.     nicotine  polacrilex (NICORETTE ) 4 MG gum Take 1 each (4 mg total) by mouth as needed for smoking cessation. 100 tablet 3   pantoprazole  (PROTONIX ) 40 MG tablet Take 1 tablet (40 mg total) by mouth daily. (Patient not taking: Reported on 08/27/2024) 30 tablet 6   polyethylene glycol (MIRALAX / GLYCOLAX) 17 g packet Take 17 g by mouth daily.     No current facility-administered medications for this visit.    Allergies as of 10/13/2024 - Review Complete 08/27/2024  Allergen Reaction Noted   Aspirin Other (See Comments)    Ibuprofen Other (See Comments)    Ivp dye [iodinated contrast media] Hives 12/01/2020   Sulfa antibiotics Other (See Comments)    Ciprofloxacin  hcl Rash 06/17/2020    Family History  Problem Relation Age of Onset   Cancer Maternal Aunt        colon   Colon cancer Maternal Aunt    Hypertension Mother    Colon polyps Father    Heart disease Brother    Diabetes Paternal Aunt    Breast cancer Cousin        maternal cousin   Cancer Son        Appendix    Social History   Socioeconomic History   Marital status: Divorced  Spouse name: Not on file   Number of children: Not on file   Years of education: Not on file   Highest education level: Not on file  Occupational History   Not on file  Tobacco Use   Smoking status: Every Day    Current packs/day: 0.50    Types: Cigarettes   Smokeless tobacco: Never  Vaping Use   Vaping status: Never Used  Substance and Sexual Activity   Alcohol use: Not Currently   Drug use: No   Sexual activity: Not Currently    Birth control/protection: Post-menopausal    Comment: 1st intercourse 23 yo-5 partners  Other Topics Concern   Not on file  Social History Narrative   Not on file   Social Drivers of Health   Financial Resource Strain: Low Risk  (09/26/2023)   Overall Financial  Resource Strain (CARDIA)    Difficulty of Paying Living Expenses: Not hard at all  Food Insecurity: No Food Insecurity (09/26/2023)   Hunger Vital Sign    Worried About Running Out of Food in the Last Year: Never true    Ran Out of Food in the Last Year: Never true  Transportation Needs: No Transportation Needs (09/26/2023)   PRAPARE - Administrator, Civil Service (Medical): No    Lack of Transportation (Non-Medical): No  Physical Activity: Insufficiently Active (09/26/2023)   Exercise Vital Sign    Days of Exercise per Week: 7 days    Minutes of Exercise per Session: 20 min  Stress: No Stress Concern Present (09/26/2023)   [employer] of Occupational Health - Occupational Stress Questionnaire    Feeling of Stress : Not at all  Social Connections: Moderately Integrated (09/26/2023)   Social Connection and Isolation Panel    Frequency of Communication with Friends and Family: More than three times a week    Frequency of Social Gatherings with Friends and Family: Twice a week    Attends Religious Services: More than 4 times per year    Active Member of [employer] or Organizations: No    Attends Engineer, Structural: More than 4 times per year    Marital Status: Divorced  Catering Manager Violence: Not At Risk (09/26/2023)   Humiliation, Afraid, Rape, and Kick questionnaire    Fear of Current or Ex-Partner: No    Emotionally Abused: No    Physically Abused: No    Sexually Abused: No    Review of Systems:    Constitutional: No weight loss, fever, chills, weakness or fatigue HEENT: Eyes: No change in vision               Ears, Nose, Throat:  No change in hearing or congestion Skin: No rash or itching Cardiovascular: No chest pain, chest pressure or palpitations   Respiratory: No SOB or cough Gastrointestinal: See HPI and otherwise negative Genitourinary: No dysuria or change in urinary frequency Neurological: No headache, dizziness or  syncope Musculoskeletal: No new muscle or joint pain Hematologic: No bleeding or bruising Psychiatric: No history of depression or anxiety    Physical Exam:  Vital signs: LMP 11/04/1996   Constitutional: NAD, alert and cooperative Head:  Normocephalic and atraumatic. Eyes:   PEERL, EOMI. No icterus. Conjunctiva pink. Respiratory: Respirations even and unlabored. Lungs clear to auscultation bilaterally.   No wheezes, crackles, or rhonchi.  Cardiovascular:  Regular rate and rhythm. No peripheral edema, cyanosis or pallor.  Gastrointestinal:  Soft, nondistended, nontender. No rebound or guarding. Normal bowel sounds. No appreciable  masses or hepatomegaly. Rectal:  Declines Msk:  Symmetrical without gross deformities. Without edema, no deformity or joint abnormality.  Neurologic:  Alert and  oriented x4;  grossly normal neurologically.  Skin:   Dry and intact without significant lesions or rashes. Psychiatric: Oriented to person, place and time. Demonstrates good judgement and reason without abnormal affect or behaviors.  Physical Exam    RELEVANT LABS AND IMAGING: CBC    Component Value Date/Time   WBC 6.0 10/04/2024 1022   RBC 4.81 10/04/2024 1022   HGB 16.5 (H) 10/04/2024 1022   HCT 48.6 (H) 10/04/2024 1022   PLT 281 10/04/2024 1022   MCV 101.0 10/04/2024 1022   MCH 34.3 (H) 10/04/2024 1022   MCHC 34.0 10/04/2024 1022   RDW 11.9 10/04/2024 1022   LYMPHSABS 1,381 03/14/2023 1010   MONOABS 0.5 11/01/2008 1636   EOSABS 90 10/04/2024 1022   BASOSABS 42 10/04/2024 1022    CMP     Component Value Date/Time   NA 144 10/04/2024 1022   K 5.5 (H) 10/04/2024 1022   CL 108 10/04/2024 1022   CO2 30 10/04/2024 1022   GLUCOSE 90 10/04/2024 1022   BUN 10 10/04/2024 1022   CREATININE 0.70 10/04/2024 1022   CALCIUM  10.1 10/04/2024 1022   PROT 6.9 10/04/2024 1022   ALBUMIN 4.4 08/20/2022 1311   AST 18 10/04/2024 1022   ALT 17 10/04/2024 1022   ALKPHOS 94 08/20/2022 1311    BILITOT 0.8 10/04/2024 1022   GFRNONAA 86 08/21/2020 0948   GFRAA 100 08/21/2020 0948     Assessment/Plan:   68 year old female with past history of diverticulitis (2021 and 2022) with severe diverticulosis and narrowing of colon on last colonoscopy, neurogenic bladder who self caths, and chronic constipation presenting for worsening constipation   Acute on chronic constipation Symptomatic sigmoid diverticular disease Suspect multifactorial with history of neurogenic bladder and narrowing of colon in association with diverticulosis without obvious stricture.  Reassuring CT abdomen pelvis 2024 (Care Everywhere)  Reassuring recent colonoscopy 2022. - Increase MiraLAX to 1 capful daily at bedtime - Squatty potty with bowel movements - Increase water, increase fiber, increase exercise -- if persistent lower GI symptoms recommend colorectal surgery consult for symptomatic sigmoid diverticular disease  Pulling sensation in rectum Internal hemorrhoids noted on colonoscopy 2022, worse with lifting things.  Suspect her worsening constipation is leading to worsening hemorrhoids but no obvious abnormality on rectal exam at last visit reassuringly - Control constipation with above regimen - Can use over-the-counter suppositories prn   History of tubular adenoma Colonoscopy 11/2020 with 2 small tubular adenomas (3 mm) and severe diverticulosis with narrowing associated with diverticula but no obvious stricture.  Maternal aunt with colon cancer.  Father with colon polyps. - Next due 11/2027   Neurogenic bladder Follows with urology, self caths   Anxiety        Gay Moncivais [facility] 10/12/2024, 11:18 AM  Cc: Perri Ronal PARAS, MD

## 2024-10-13 ENCOUNTER — Encounter: Payer: Self-pay | Admitting: Gastroenterology

## 2024-10-13 ENCOUNTER — Ambulatory Visit: Admitting: Gastroenterology

## 2024-10-13 VITALS — BP 112/64 | HR 86 | Ht 63.0 in | Wt 102.5 lb

## 2024-10-13 DIAGNOSIS — K5730 Diverticulosis of large intestine without perforation or abscess without bleeding: Secondary | ICD-10-CM

## 2024-10-13 DIAGNOSIS — K6289 Other specified diseases of anus and rectum: Secondary | ICD-10-CM | POA: Diagnosis not present

## 2024-10-13 DIAGNOSIS — Z860101 Personal history of adenomatous and serrated colon polyps: Secondary | ICD-10-CM | POA: Diagnosis not present

## 2024-10-13 DIAGNOSIS — K5909 Other constipation: Secondary | ICD-10-CM | POA: Diagnosis not present

## 2024-10-13 DIAGNOSIS — K219 Gastro-esophageal reflux disease without esophagitis: Secondary | ICD-10-CM | POA: Diagnosis not present

## 2024-10-13 DIAGNOSIS — Z860100 Personal history of colon polyps, unspecified: Secondary | ICD-10-CM

## 2024-10-13 DIAGNOSIS — N319 Neuromuscular dysfunction of bladder, unspecified: Secondary | ICD-10-CM | POA: Diagnosis not present

## 2024-10-13 MED ORDER — PANTOPRAZOLE SODIUM 40 MG PO TBEC: 40.0000 mg | DELAYED_RELEASE_TABLET | Freq: Every day | ORAL | 3 refills | Status: AC

## 2024-10-13 MED ORDER — AMBULATORY NON FORMULARY MEDICATION: 0 refills | Status: AC

## 2024-10-13 NOTE — Patient Instructions (Addendum)
 Given the severity and frequency of of symptoms, I am recommending topical therapy NTG 0.125% use gloved hand, apply pea size amount every 6-8 hours for pain rectally, #30 gram no refills.  We have sent a prescription for nitroglycerin 0.125% gel to [facility]. You should apply a pea size amount to your rectum every 6 to 8 hours.  [facility]'s information is below: Address: [address], [place], [place] [address]  [phone]  *Please DO NOT go directly from our office to pick up this medication! Give the pharmacy 1 day to process the prescription as this is compounded and takes time to make.  _______________________________________________________  If your blood pressure at your visit was 140/90 or greater, please contact your primary care physician to follow up on this.  _______________________________________________________  If you are age 8 or older, your body mass index should be between 23-30. Your Body mass index is 18.16 kg/m. If this is out of the aforementioned range listed, please consider follow up with your Primary Care Provider.  If you are age 17 or younger, your body mass index should be between 19-25. Your Body mass index is 18.16 kg/m. If this is out of the aformentioned range listed, please consider follow up with your Primary Care Provider.   ________________________________________________________  The [facility] providers would like to encourage you to use MYCHART to communicate with providers for non-urgent requests or questions.  Due to long hold times on the telephone, sending your provider a message by [facility] may be a faster and more efficient way to get a response.  Please allow 48 business hours for a response.  Please remember that this is for non-urgent requests.  _______________________________________________________  [facility] is using a team-based approach to care.  Your team is made up of your doctor and two  to three APPS. Our APPS (Nurse Practitioners and Physician Assistants) work with your physician to ensure care continuity for you. They are fully qualified to address your health concerns and develop a treatment plan. They communicate directly with your gastroenterologist to care for you. Seeing the Advanced Practice Practitioners on your physician's team can help you by facilitating care more promptly, often allowing for earlier appointments, access to diagnostic testing, procedures, and other specialty referrals.

## 2024-10-20 ENCOUNTER — Ambulatory Visit

## 2024-10-22 ENCOUNTER — Inpatient Hospital Stay: Admission: RE | Admit: 2024-10-22 | Discharge: 2024-10-22 | Attending: Internal Medicine | Admitting: Internal Medicine

## 2024-10-22 ENCOUNTER — Ambulatory Visit: Admitting: Internal Medicine

## 2024-10-22 ENCOUNTER — Encounter: Payer: Self-pay | Admitting: Internal Medicine

## 2024-10-22 VITALS — BP 110/60 | HR 88 | Ht 65.2 in | Wt 100.0 lb

## 2024-10-22 DIAGNOSIS — I2510 Atherosclerotic heart disease of native coronary artery without angina pectoris: Secondary | ICD-10-CM | POA: Diagnosis not present

## 2024-10-22 DIAGNOSIS — Z0000 Encounter for general adult medical examination without abnormal findings: Secondary | ICD-10-CM

## 2024-10-22 DIAGNOSIS — R931 Abnormal findings on diagnostic imaging of heart and coronary circulation: Secondary | ICD-10-CM

## 2024-10-22 DIAGNOSIS — Z789 Other specified health status: Secondary | ICD-10-CM

## 2024-10-22 DIAGNOSIS — M8580 Other specified disorders of bone density and structure, unspecified site: Secondary | ICD-10-CM

## 2024-10-22 DIAGNOSIS — N398 Other specified disorders of urinary system: Secondary | ICD-10-CM

## 2024-10-22 DIAGNOSIS — F419 Anxiety disorder, unspecified: Secondary | ICD-10-CM | POA: Diagnosis not present

## 2024-10-22 DIAGNOSIS — Z87898 Personal history of other specified conditions: Secondary | ICD-10-CM

## 2024-10-22 DIAGNOSIS — K219 Gastro-esophageal reflux disease without esophagitis: Secondary | ICD-10-CM | POA: Diagnosis not present

## 2024-10-22 DIAGNOSIS — R42 Dizziness and giddiness: Secondary | ICD-10-CM

## 2024-10-22 DIAGNOSIS — M810 Age-related osteoporosis without current pathological fracture: Secondary | ICD-10-CM

## 2024-10-22 DIAGNOSIS — F17200 Nicotine dependence, unspecified, uncomplicated: Secondary | ICD-10-CM | POA: Diagnosis not present

## 2024-10-22 DIAGNOSIS — Z1231 Encounter for screening mammogram for malignant neoplasm of breast: Secondary | ICD-10-CM

## 2024-10-22 DIAGNOSIS — Z87891 Personal history of nicotine dependence: Secondary | ICD-10-CM

## 2024-10-22 MED ORDER — ALPRAZOLAM 0.5 MG PO TABS: 0.5000 mg | ORAL_TABLET | Freq: Two times a day (BID) | ORAL | 1 refills | Status: AC | PRN

## 2024-10-22 NOTE — Patient Instructions (Addendum)
 Ms. Pluta,  Thank you for taking the time for your Medicare Wellness Visit. I appreciate your continued commitment to your health goals. Please review the care plan we discussed, and feel free to reach out if I can assist you further.  Please note that Annual Wellness Visits do not include a physical exam. Some assessments may be limited, especially if the visit was conducted virtually. If needed, we may recommend an in-person follow-up with your provider.  Ongoing Care Seeing your primary care provider every 3 to 6 months helps us  monitor your health and provide consistent, personalized care.   Referrals If a referral was made during today's visit and you haven't received any updates within two weeks, please contact the referred provider directly to check on the status.  Recommended Screenings:  Health Maintenance  Topic Date Due   DTaP/Tdap/Td vaccine (1 - Tdap) Never done   Medicare Annual Wellness Visit  09/25/2024   COVID-19 Vaccine (1 - 2025-26 season) 11/07/2024*   Zoster (Shingles) Vaccine (1 of 2) 01/20/2025*   Flu Shot  02/01/2025*   Pneumococcal Vaccine for age over 96 (1 of 2 - PCV) 10/22/2025*   Breast Cancer Screening  10/19/2025   Colon Cancer Screening  11/17/2027   Osteoporosis screening with Bone Density Scan  Completed   Meningitis B Vaccine  Aged Out   Hepatitis C Screening  Discontinued  *Topic was postponed. The date shown is not the original due date.       10/22/2024    2:52 PM  Advanced Directives  Does Patient Have a Medical Advance Directive? No  Would patient like information on creating a medical advance directive? No - Patient declined    Vision: Annual vision screenings are recommended for early detection of glaucoma, cataracts, and diabetic retinopathy. These exams can also reveal signs of chronic conditions such as diabetes and high blood pressure.  Dental: Annual dental screenings help detect early signs of oral cancer, gum disease, and other  conditions linked to overall health, including heart disease and diabetes.  Please see the attached documents for additional preventive care recommendations.   Keep appt with GYN next month. It was a pleasure to see you today. Vaccines declined by patient today. Please continue to work on smoking cessation and try to quit entirely. Congratulations on cutting down to 8 cigarettes a day.Continue self cath follow up with [facility]. Continue Xanax  for anxiety. No more vertigo episodes noted. Return in one year or as needed.

## 2024-10-22 NOTE — Progress Notes (Signed)
 "  Chief Complaint  Patient presents with   Annual Exam   Medicare Wellness     Subjective:   Paige Lawrence is a 68 y.o. female who presents for a Medicare Annual Wellness Visit.  Visit info / Clinical Intake: Medicare Wellness Visit Type:: Subsequent Annual Wellness Visit Persons participating in visit and providing information:: patient Medicare Wellness Visit Mode:: In-person (required for WTM) Interpreter Needed?: No Pre-visit prep was completed: yes AWV questionnaire completed by patient prior to visit?: no Living arrangements:: (!) lives alone Patient's Overall Health Status Rating: very good Typical amount of pain: none Does pain affect daily life?: no Are you currently prescribed opioids?: no  Dietary Habits and Nutritional Risks How many meals a day?: 3 Eats fruit and vegetables daily?: yes Most meals are obtained by: preparing own meals In the last 2 weeks, have you had any of the following?: none Diabetic:: no  Functional Status Activities of Daily Living (to include ambulation/medication): Independent Ambulation: Independent Medication Administration: Independent Home Management (perform basic housework or laundry): Independent Manage your own finances?: (!) no Primary transportation is: driving Concerns about vision?: no *vision screening is required for WTM* Concerns about hearing?: no  Fall Screening Falls in the past year?: 0 Number of falls in past year: 0 Was there an injury with Fall?: 0 Fall Risk Category Calculator: 0 Patient Fall Risk Level: Low Fall Risk  Fall Risk Patient at Risk for Falls Due to: No Fall Risks Fall risk Follow up: Falls prevention discussed; Education provided; Falls evaluation completed  Home and Transportation Safety: All rugs have non-skid backing?: N/A, no rugs All stairs or steps have railings?: yes Grab bars in the bathtub or shower?: yes Have non-skid surface in bathtub or shower?: yes Good home lighting?:  yes Regular seat belt use?: yes Hospital stays in the last year:: no  Cognitive Assessment Difficulty concentrating, remembering, or making decisions? : no Will 6CIT or Mini Cog be Completed: no 6CIT or Mini Cog Declined: patient alert, oriented, able to answer questions appropriately and recall recent events  Advance Directives (For Healthcare) Does Patient Have a Medical Advance Directive?: No Would patient like information on creating a medical advance directive?: No - Patient declined  Reviewed/Updated  Reviewed/Updated: Reviewed All (Medical, Surgical, Family, Medications, Allergies, Care Teams, Patient Goals)    Allergies (verified) Aspirin, Ibuprofen, Ivp dye [iodinated contrast media], Sulfa antibiotics, and Ciprofloxacin  hcl   Current Medications (verified) Outpatient Encounter Medications as of 10/22/2024  Medication Sig   ALPRAZolam  (XANAX ) 0.5 MG tablet One tab by mouth up to 3 times daily as needed for acute vertigo   AMBULATORY NON FORMULARY MEDICATION Nitroglycerine ointment 0.125 %  Apply a pea sized amount internally every 6 to 8 hour for rectal pain.   Cholecalciferol (VITAMIN D3 PO) Take 1 tablet by mouth daily.   nicotine  polacrilex (NICORETTE ) 4 MG gum Take 1 each (4 mg total) by mouth as needed for smoking cessation.   pantoprazole  (PROTONIX ) 40 MG tablet Take 1 tablet (40 mg total) by mouth daily.   polyethylene glycol (MIRALAX / GLYCOLAX) 17 g packet Take 17 g by mouth daily.   No facility-administered encounter medications on file as of 10/22/2024.    History: Past Medical History:  Diagnosis Date   Aortic atherosclerosis    Arrhythmia 1998   Cholelithiasis    Colon polyps    DDD (degenerative disc disease), lumbar    Diverticulosis    GERD (gastroesophageal reflux disease)    Heart murmur 1998  Hemorrhoids    Herniated disc, cervical    c6-7   Hiatal hernia    Hx of spinal stenosis    Hypoglycemic reaction    Internal hemorrhoids     Mitral valve prolapse    Osteopenia    -1.7 2/05; -1.4 3/07; -2.2 7/11   Uterine fibroid    Vitamin D  deficiency    26 4/10; 28 5/11   Past Surgical History:  Procedure Laterality Date   CESAREAN SECTION     COLONOSCOPY  11/16/2020   WISDOM TOOTH EXTRACTION     Family History  Problem Relation Age of Onset   Cancer Maternal Aunt        colon   Colon cancer Maternal Aunt    Hypertension Mother    Colon polyps Father    Heart disease Brother    Diabetes Paternal Aunt    Breast cancer Cousin        maternal cousin   Cancer Son        Appendix   Social History   Occupational History   Not on file  Tobacco Use   Smoking status: Every Day    Current packs/day: 0.50    Types: Cigarettes   Smokeless tobacco: Never  Vaping Use   Vaping status: Never Used  Substance and Sexual Activity   Alcohol use: Not Currently   Drug use: No   Sexual activity: Not Currently    Birth control/protection: Post-menopausal    Comment: 1st intercourse 62 yo-5 partners   Tobacco Counseling Ready to quit: No Counseling given: Yes  SDOH Screenings   Food Insecurity: No Food Insecurity (10/22/2024)  Housing: Low Risk (10/22/2024)  Transportation Needs: No Transportation Needs (10/22/2024)  Utilities: Not At Risk (10/22/2024)  Alcohol Screen: Low Risk (10/22/2024)  Depression (PHQ2-9): Low Risk (10/22/2024)  Financial Resource Strain: Low Risk (10/22/2024)  Physical Activity: Sufficiently Active (10/22/2024)  Social Connections: Moderately Integrated (10/22/2024)  Stress: No Stress Concern Present (10/22/2024)  Tobacco Use: High Risk (10/22/2024)  Health Literacy: Adequate Health Literacy (10/22/2024)   See flowsheets for full screening details  Depression Screen PHQ 2 & 9 Depression Scale- Over the past 2 weeks, how often have you been bothered by any of the following problems? Little interest or pleasure in doing things: 0 Feeling down, depressed, or hopeless (PHQ Adolescent also  includes...irritable): 0 PHQ-2 Total Score: 0 Trouble falling or staying asleep, or sleeping too much: 0 Feeling tired or having little energy: 0 Poor appetite or overeating (PHQ Adolescent also includes...weight loss): 0 Feeling bad about yourself - or that you are a failure or have let yourself or your family down: 0 Trouble concentrating on things, such as reading the newspaper or watching television (PHQ Adolescent also includes...like school work): 0 Moving or speaking so slowly that other people could have noticed. Or the opposite - being so fidgety or restless that you have been moving around a lot more than usual: 0 Thoughts that you would be better off dead, or of hurting yourself in some way: 0 PHQ-9 Total Score: 0 If you checked off any problems, how difficult have these problems made it for you to do your work, take care of things at home, or get along with other people?: Not difficult at all     Goals Addressed               This Visit's Progress     Activity and Exercise Increased (pt-stated)        Evidence-based  guidance:  Review current exercise levels.  Assess patient perspective on exercise or activity level, barriers to increasing activity, motivation and readiness for change.  Recommend or set healthy exercise goal based on individual tolerance.  Encourage small steps toward making change in amount of exercise or activity.  Urge reduction of sedentary activities or screen time.  Promote group activities within the community or with family or support person.  Consider referral to rehabiliation therapist for assessment and exercise/activity plan.   Notes:       DIET - EAT MORE FRUITS AND VEGETABLES (pt-stated)               Objective:    Today's Vitals   10/22/24 1454  BP: 110/60  Pulse: 88  SpO2: 95%  Weight: 100 lb (45.4 kg)  Height: 5' 5.2 (1.656 m)  PainSc: 0-No pain   Body mass index is 16.54 kg/m.  Hearing/Vision screen Vision Screening  - Comments:: Patient states she is up to date with her yearly eye exam done [facility]  Immunizations and Health Maintenance Health Maintenance  Topic Date Due   DTaP/Tdap/Td (1 - Tdap) Never done   COVID-19 Vaccine (1 - 2025-26 season) 11/07/2024 (Originally 07/05/2024)   Zoster Vaccines- Shingrix (1 of 2) 01/20/2025 (Originally 10/15/2006)   Influenza Vaccine  02/01/2025 (Originally 06/04/2024)   Pneumococcal Vaccine: 50+ Years (1 of 2 - PCV) 10/22/2025 (Originally 10/16/1975)   Mammogram  10/19/2025   Medicare Annual Wellness (AWV)  10/22/2025   Colonoscopy  11/17/2027   Bone Density Scan  Completed   Meningococcal B Vaccine  Aged Out   Hepatitis C Screening  Discontinued        Assessment/Plan:  This is a routine wellness examination for Paige Lawrence.  Patient Care Team: Perri Ronal PARAS, MD as PCP - General (Internal Medicine)  I have personally reviewed and noted the following in the patients chart:   Medical and social history Use of alcohol, tobacco or illicit drugs  Current medications and supplements including opioid prescriptions. Functional ability and status Nutritional status Physical activity Advanced directives List of other physicians Hospitalizations, surgeries, and ER visits in previous 12 months Vitals Screenings to include cognitive, depression, and falls Referrals and appointments  No orders of the defined types were placed in this encounter.  In addition, I have reviewed and discussed with patient certain preventive protocols, quality metrics, and best practice recommendations. A written personalized care plan for preventive services as well as general preventive health recommendations were provided to patient.   Araceli Zelda, CMA   10/22/2024   Return in 1 year (on 10/22/2025).  After Visit Summary: (In Person-Printed) AVS printed and given to the patient  Nurse Notes: none  I, Ronal PARAS Perri, MD, have reviewed all documentation for this  visit. The documentation on 10/22/2024 for the exam, diagnosis, procedures, and orders are all accurate and complete.  "

## 2024-10-30 ENCOUNTER — Encounter: Payer: Self-pay | Admitting: Internal Medicine

## 2024-11-30 ENCOUNTER — Encounter: Admitting: Radiology

## 2024-12-09 ENCOUNTER — Encounter: Payer: Self-pay | Admitting: Radiology

## 2024-12-09 ENCOUNTER — Ambulatory Visit: Admitting: Radiology

## 2024-12-09 VITALS — BP 120/64 | HR 77 | Ht 63.0 in | Wt 100.0 lb

## 2024-12-09 DIAGNOSIS — M810 Age-related osteoporosis without current pathological fracture: Secondary | ICD-10-CM

## 2024-12-09 DIAGNOSIS — R350 Frequency of micturition: Secondary | ICD-10-CM | POA: Diagnosis not present

## 2024-12-09 DIAGNOSIS — Z01419 Encounter for gynecological examination (general) (routine) without abnormal findings: Secondary | ICD-10-CM

## 2024-12-09 DIAGNOSIS — Z9189 Other specified personal risk factors, not elsewhere classified: Secondary | ICD-10-CM

## 2024-12-09 DIAGNOSIS — N9489 Other specified conditions associated with female genital organs and menstrual cycle: Secondary | ICD-10-CM

## 2024-12-09 LAB — URINALYSIS, COMPLETE W/RFL CULTURE
Bacteria, UA: NONE SEEN /HPF
Bilirubin Urine: NEGATIVE
Glucose, UA: NEGATIVE
Hyaline Cast: NONE SEEN /LPF
Ketones, ur: NEGATIVE
Leukocyte Esterase: NEGATIVE
Nitrites, Initial: NEGATIVE
Protein, ur: NEGATIVE
Specific Gravity, Urine: 1.01 (ref 1.001–1.035)
pH: 6.5 (ref 5.0–8.0)

## 2024-12-09 LAB — WET PREP FOR TRICH, YEAST, CLUE

## 2024-12-09 LAB — CULTURE INDICATED

## 2024-12-09 NOTE — Progress Notes (Signed)
 "  Paige Lawrence 05-25-1956 [id number]   History: Postmenopausal 69 y.o. presents for annual exam. Would like UA today, self caths multiple times a day for neurogenic bladder and does not know when she has a UTI. C/o vaginal burning, concerned she has a yeast infection. Osteoporosis diagnosed in 2023, started fosamax  but did not tolerate.   Risk Factors for Medicare Patients >/= 5 sexual partners in a lifetime: Yes  First intercourse <12 years of age: No  H/O STD at any age: No  Abnormal pap smear, < 3 negative paps within the last 7 years: No  DES exposure (women born between [phone]): No  Patient is on post breast cancer medication like Femara or, if medication like this is not needed, 5 years post breast cancer diagnosis: No   Gynecologic History Postmenopausal Last Pap: 2023. Results were: normal Last mammogram: 12/25. Results were: normal Last colonoscopy: 2022 [id number] HRT use: no  Obstetric History OB History  Gravida Para Term Preterm AB Living  2 2    2   SAB IAB Ectopic Multiple Live Births          # Outcome Date GA Lbr Len/2nd Weight Sex Type Anes PTL Lv  2 Para           1 Para              The following portions of the patient's history were reviewed and updated as appropriate: allergies, current medications, past family history, past medical history, past social history, past surgical history, and problem list.  Review of Systems Pertinent items noted in HPI and remainder of comprehensive ROS otherwise negative.  Past medical history, past surgical history, family history and social history were all reviewed and documented in the EPIC chart.  Exam:  Vitals:   12/09/24 1157  BP: 120/64  Pulse: 77  SpO2: 97%  Weight: 100 lb (45.4 kg)  Height: 5' 3 (1.6 m)   Body mass index is 17.71 kg/m.  General appearance:  Normal Thyroid:  Symmetrical, normal in size, without palpable masses or nodularity. Respiratory  Auscultation:  Clear without wheezing or  rhonchi Cardiovascular  Auscultation:  Regular rate, without rubs, murmurs or gallops  Edema/varicosities:  Not grossly evident Abdominal  Soft,nontender, without masses, guarding or rebound.  Liver/spleen:  No organomegaly noted  Hernia:  None appreciated  Skin  Inspection:  Grossly normal Breasts: Examined lying and sitting.   Right: Without masses, retractions, nipple discharge or axillary adenopathy.   Left: Without masses, retractions, nipple discharge or axillary adenopathy. Genitourinary   Inguinal/mons:  Normal without inguinal adenopathy  External genitalia:  Normal appearing vulva with no masses, tenderness, or lesions  BUS/Urethra/Skene's glands:  Normal  Vagina:  Normal appearing with normal color. + scant white discharge, no lesions. Atrophy: moderate   Cervix:  Normal appearing without discharge or lesions  Uterus:  Normal in size, shape and contour.  Midline and mobile, nontender  Adnexa/parametria:     Rt: Normal in size, without masses or tenderness.   Lt: Normal in size, without masses or tenderness.  Anus and perineum: Normal    Darice Hoit, CMA present for exam  Microscopic wet-mount exam shows negative for pathogens, normal epithelial cells .  Urine dipstick shows positive for RBC's.  Micro exam: 0-5 WBC's per HPF and 0-2 RBC's per HPF.   Assessment/Plan:   1. Encounter for breast and pelvic examination (Primary) HR medicare, f/u in 1 year  2. Urinary frequency - Urinalysis,Complete w/RFL Culture  3.  Age-related osteoporosis without current pathological fracture - DG Bone Density; Future  4. Vaginal burning - WET PREP FOR TRICH, YEAST, CLUE  Reassured negative, offered vaginal estrogen but declines, will try coconut oil  Return in 1 year for annual or sooner prn.  Marijean Montanye B WHNP-BC, 12:02 PM 12/09/2024 "

## 2025-12-14 ENCOUNTER — Encounter: Admitting: Radiology
